# Patient Record
Sex: Female | Born: 1950 | Race: White | Hispanic: No | Marital: Single | Smoking: Current every day smoker
Health system: Southern US, Community
[De-identification: ages and names within clinical notes are randomized; demographics above are authoritative.]

## PROBLEM LIST (undated history)

## (undated) DIAGNOSIS — H5711 Ocular pain, right eye: Secondary | ICD-10-CM

## (undated) DIAGNOSIS — I639 Cerebral infarction, unspecified: Secondary | ICD-10-CM

## (undated) DIAGNOSIS — F17200 Nicotine dependence, unspecified, uncomplicated: Secondary | ICD-10-CM

## (undated) DIAGNOSIS — I700 Atherosclerosis of aorta: Secondary | ICD-10-CM

## (undated) DIAGNOSIS — K089 Disorder of teeth and supporting structures, unspecified: Secondary | ICD-10-CM

## (undated) DIAGNOSIS — I10 Essential (primary) hypertension: Secondary | ICD-10-CM

## (undated) DIAGNOSIS — H02829 Cysts of unspecified eye, unspecified eyelid: Secondary | ICD-10-CM

## (undated) DIAGNOSIS — E119 Type 2 diabetes mellitus without complications: Secondary | ICD-10-CM

## (undated) HISTORY — DX: Cysts of unspecified eye, unspecified eyelid: H02.829

## (undated) HISTORY — DX: Disorder of teeth and supporting structures, unspecified: K08.9

## (undated) HISTORY — DX: Nicotine dependence, unspecified, uncomplicated: F17.200

## (undated) HISTORY — DX: Ocular pain, right eye: H57.11

## (undated) HISTORY — DX: Cerebral infarction, unspecified: I63.9

## (undated) HISTORY — DX: Atherosclerosis of aorta: I70.0

## (undated) SURGERY — EXCISION MASS
Anesthesia: General | Laterality: Right

---

## 2015-01-23 ENCOUNTER — Encounter (HOSPITAL_COMMUNITY): Payer: Self-pay | Admitting: Emergency Medicine

## 2015-01-23 ENCOUNTER — Emergency Department (HOSPITAL_COMMUNITY)
Admission: EM | Admit: 2015-01-23 | Discharge: 2015-01-23 | Disposition: A | Discharge status: Home / Self Care | Payer: Medicaid Other | Source: Home / Self Care | Attending: Family Medicine | Admitting: Family Medicine

## 2015-01-23 DIAGNOSIS — E1165 Type 2 diabetes mellitus with hyperglycemia: Secondary | ICD-10-CM | POA: Diagnosis not present

## 2015-01-23 DIAGNOSIS — IMO0002 Reserved for concepts with insufficient information to code with codable children: Secondary | ICD-10-CM

## 2015-01-23 DIAGNOSIS — I959 Hypotension, unspecified: Secondary | ICD-10-CM

## 2015-01-23 HISTORY — DX: Essential (primary) hypertension: I10

## 2015-01-23 HISTORY — DX: Type 2 diabetes mellitus without complications: E11.9

## 2015-01-23 LAB — POCT I-STAT, CHEM 8
BUN: 7 mg/dL (ref 6–20)
CHLORIDE: 100 mmol/L — AB (ref 101–111)
Calcium, Ion: 1.25 mmol/L (ref 1.13–1.30)
Creatinine, Ser: 0.5 mg/dL (ref 0.44–1.00)
GLUCOSE: 300 mg/dL — AB (ref 65–99)
HCT: 46 % (ref 36.0–46.0)
Hemoglobin: 15.6 g/dL — ABNORMAL HIGH (ref 12.0–15.0)
POTASSIUM: 4.1 mmol/L (ref 3.5–5.1)
Sodium: 138 mmol/L (ref 135–145)
TCO2: 25 mmol/L (ref 0–100)

## 2015-01-23 MED ORDER — METFORMIN HCL 500 MG PO TABS: 500.0000 mg | ORAL_TABLET | Freq: Two times a day (BID) | ORAL | Status: DC

## 2015-01-23 NOTE — Discharge Instructions (Signed)
Your sugar is very high today. It was 300. Restart your metformin immediately. Please call our clinic tomorrow at [phone] and ask to speak to our social worker, Seward GraterMaggie,. She can help you find a new doctor. If you're unable to communicate with her please call the community health [facility] wellness Center for an appointment. Her blood pressure was too low to start blood pressure medicine. If her sugar gets to be above 200 or 300 and you become ill please go to the emergency room. Please check your sugar twice a day.

## 2015-01-23 NOTE — ED Provider Notes (Signed)
CSN: [id number]     Arrival date & time 01/23/15  1430 History   First MD Initiated Contact with Patient 01/23/15 1534     Chief Complaint  Patient presents with  . Medication Refill   (Consider location/radiation/quality/duration/timing/severity/associated sxs/prior Treatment) HPI Patient counter it aided by granddaughter who actually interpreter for patient. [place] a native [place] been in [place]. Moved to [place] 3 months ago and has not established care with a new primary care physician for refills. Has been out of her medications for this entire period of time. Does not have a glucose meter and does not check her sugars.   Diabetes: Denies tremor, headache, nausea, vomiting, diaphoresis, chest pain, palpitations. Patient states that she was on metformin 500 mg twice a day as well as glipizide 5 mg twice a day previously. Unsure what her A1c was.  Hypertension: Patient states that she is on lisinopril 5 mg prior to moving to [place] .      Past Medical History  Diagnosis Date  . Diabetes mellitus without complication   . Hypertension    History reviewed. No pertinent past surgical history. Family History  Problem Relation Age of Onset  . Family history unknown: Yes   History  Substance Use Topics  . Smoking status: Current Every Day Smoker -- 0.10 packs/day    Types: Cigarettes  . Smokeless tobacco: Not on file  . Alcohol Use: No   OB History    No data available     Review of Systems Per HPI with all other pertinent systems negative.   Allergies  Review of patient's allergies indicates no known allergies.  Home Medications   Prior to Admission medications   Medication Sig Start Date End Date Taking? Authorizing Provider  metFORMIN (GLUCOPHAGE) 500 MG tablet Take 1 tablet (500 mg total) by mouth 2 (two) times daily with a meal. 01/23/15   Ozella Rocksavid J Merrell, MD   BP 90/66 mmHg  Pulse 87  Temp(Src) 97.6 F (36.4  C) (Oral)  Resp 16  SpO2 93% Physical Exam Physical Exam  Constitutional: oriented to person, place, and time. appears well-developed and well-nourished. No distress.  HENT:  Head: Normocephalic and atraumatic.  Eyes: EOMI. PERRL.  Neck: Normal range of motion.  Cardiovascular: RRR, no m/r/g, 2+ distal pulses,  Pulmonary/Chest: Effort normal and breath sounds normal. No respiratory distress.  Abdominal: Soft. Bowel sounds are normal. NonTTP, no distension.  Musculoskeletal: Normal range of motion. Non ttp, no effusion.  Neurological: Cranial nerves II through XII grossly intact.  Skin: Skin is warm. No rash noted. non diaphoretic.  Psychiatric: normal mood and affect. behavior is normal. Judgment and thought content normal.   ED Course  Procedures (including critical care time) Labs Review Labs Reviewed  POCT I-STAT, CHEM 8 - Abnormal; Notable for the following:    Chloride 100 (*)    Glucose, Bld 300 (*)    Hemoglobin 15.6 (*)    All other components within normal limits    Imaging Review No results found.   MDM   1. Diabetes mellitus out of control   2. Hypotension, unspecified hypotension type    Chem-8 showing hyperglycemia with sugar of 300. No Noted. Patient to restart her metformin today. Creatinine normal. Patient a sympathetic with regards to her hyperglycemia. Prescription for a new diabetic meter and diabetic test strips was given to the patient and she was counseled to check her sugars twice daily. Will not start  glipizide at this point time. Patient go to the ED if she develops hyperglycemia along with other systemic symptoms. Of note patient is hypotensive at this current point in time and starting lisinopril 5 mg which was her previous dose is not advisable. Patient to call tomorrow to speak to our social worker with regards to establishing care primary care office or will call the daily health wellness Center tomorrow to establish her care for the  future.    Ozella Rocksavid J Merrell, MD 01/23/15 [phone]

## 2015-01-23 NOTE — ED Notes (Signed)
Reports being out BP med and DM meds for 3 months.   C/o mild headaches.   Denies any other symptoms

## 2015-03-03 ENCOUNTER — Ambulatory Visit (INDEPENDENT_AMBULATORY_CARE_PROVIDER_SITE_OTHER): Payer: Medicaid Other | Admitting: Family Medicine

## 2015-03-03 VITALS — BP 136/65 | HR 87 | Temp 98.1°F | Ht <= 58 in | Wt 91.4 lb

## 2015-03-03 DIAGNOSIS — E119 Type 2 diabetes mellitus without complications: Secondary | ICD-10-CM

## 2015-03-03 DIAGNOSIS — Z0289 Encounter for other administrative examinations: Secondary | ICD-10-CM

## 2015-03-03 DIAGNOSIS — K088 Other specified disorders of teeth and supporting structures: Secondary | ICD-10-CM | POA: Diagnosis not present

## 2015-03-03 DIAGNOSIS — K089 Disorder of teeth and supporting structures, unspecified: Secondary | ICD-10-CM

## 2015-03-03 DIAGNOSIS — Z008 Encounter for other general examination: Secondary | ICD-10-CM | POA: Diagnosis not present

## 2015-03-03 MED ORDER — GLIPIZIDE 5 MG PO TABS: 5.0000 mg | ORAL_TABLET | Freq: Two times a day (BID) | ORAL | Status: DC

## 2015-03-03 MED ORDER — METFORMIN HCL 1000 MG PO TABS: 1000.0000 mg | ORAL_TABLET | Freq: Two times a day (BID) | ORAL | Status: DC

## 2015-03-03 NOTE — Progress Notes (Signed)
Patient ID: Kaylee Ramos, female   DOB: 02/25/1951, 64 y.o.   MRN: [id number] Nepali interpreter utilized during today's visit.  [facility]  HPI:  [facility] for a new patient appointment to establish general primary care, also to discuss diabetes.  DIABETES - had for 8-9 years Disease Monitoring: Blood Sugar ranges-300-400, sometimes up to 500 and other times 150 Polyuria/phagia/dipsia- polyuria, nocturia, polydipsia      Visual problems- vision is ok, has eyelid lesion Medications: Compliance- takes metformin 500 BID, has been out of this Hypoglycemic symptoms- no tremor, some sweating though this improves with drinking water  ROS: See HPI  Immigrant Social History: - Name spelling correct?: [name] - Date arrived in [place]: 1.5 years ago - Country of origin: [place] born [place] - Location of refugee camp (if applicable), how long there, and what caused patient to leave home country?: was in refugee camp for 25 years. Left due to political dispute. 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 - Best contact name and number: [phone] - Tobacco/alcohol/drug use: smokes 2-3 cigs per day, no alcohol or drugs - Marriage Status: widowed - Sexual activity: no  Preventative Care History: -Seen at health department?: went when arrived in [place], not in [place]  -had blood drawn and immunizations in [place]  Past Medical Hx:  -DM -history of issue with right hand -  Decreased strength in right hand for 7-8 years  Past Surgical Hx:  -left eye lid surgery  Family Hx: updated in Epic - Number of family members:  3 kids - Number of family members in [place]:  2 daughters in [place]  PHYSICAL EXAM: BP 136/65 mmHg  Pulse 87  Temp(Src) 98.1 F (36.7 C) (Oral)  Ht 4' 9.5" (1.461 m)  Wt 91 lb 6.4 oz (41.459 kg)  BMI 19.42 kg/m2 Gen:  NAD, resting comfortably on exam table HEENT: NCAT, MMM, poor dentition with no teeth on top, missing teeth and discolored teeth on bottom of mouth, no cervical LAD Neck:  Supple, no LAD Heart: rrr, no murmur appreciated Lungs: CTAB, nml WOB Abdomen: s, NT, ND Skin:  Right eye lid with ~0.5 cm nodule in the middle of the eyelid with no erythema or tenderness MSK: no joint swelling, normal muscle bulk Neuro: alert, 4+/5 strength right hand grip, otherwise 5/5 strength UE Psych: normal affect  Examined and interviewed with Dr. Gwendolyn GrantWalden  FOLLOW UP: F/u in 2 weeks for DM.  Diabetes Patient with poorly controlled DM per report of CBGs at home. Will increase metformin to 1000 mg BID. Will start on glipizide 5 mg BID. Advised that these are $4 meds at [facility]. Given paper prescriptions. Patient left the office prior to having A1c performed. Will have patient return for A1c check.  Poor dentition Patient with poor dentition. Will refer to dentist.

## 2015-03-03 NOTE — Patient Instructions (Signed)
Nice to meet you. We are going to start you on glipizide for your diabetes and increase the dose of your metformin.  We will refer you to a dentist. We would like for you to come back in 2 weeks to follow-up.

## 2015-03-04 DIAGNOSIS — Z0289 Encounter for other administrative examinations: Secondary | ICD-10-CM | POA: Insufficient documentation

## 2015-03-04 DIAGNOSIS — K089 Disorder of teeth and supporting structures, unspecified: Secondary | ICD-10-CM

## 2015-03-04 DIAGNOSIS — E119 Type 2 diabetes mellitus without complications: Secondary | ICD-10-CM | POA: Insufficient documentation

## 2015-03-04 HISTORY — DX: Disorder of teeth and supporting structures, unspecified: K08.9

## 2015-03-04 NOTE — Assessment & Plan Note (Addendum)
Patient with poorly controlled DM per report of CBGs at home. Will increase metformin to 1000 mg BID. Will start on glipizide 5 mg BID. Advised that these are $4 meds at [facility]. Given paper prescriptions. Patient left the office prior to having A1c performed. Will have patient return for A1c check.

## 2015-03-04 NOTE — Assessment & Plan Note (Signed)
Patient with poor dentition. Will refer to dentist.

## 2015-03-24 ENCOUNTER — Telehealth: Payer: Self-pay | Admitting: Family Medicine

## 2015-03-24 NOTE — Telephone Encounter (Signed)
Patient's Neighbor dropped Form to be complete and signed by PCP by March 26, 2015. Please, follow up with Mr. Kaylee Ramos.

## 2015-03-24 NOTE — Telephone Encounter (Signed)
Reviewed patient chart. Not having met pt before, there isn't enough documented for me to complete the form. Will defer to Dr. Mingo Amber upon his return.  Leeanne Rio, MD

## 2015-03-24 NOTE — Telephone Encounter (Signed)
Forms given to Dr. Pollie MeyerMcIntyre to see if she could complete them. Kaylee Ramos, Mahala Rommel D, [place]

## 2015-03-26 NOTE — Telephone Encounter (Signed)
I will complete at her appointment in 2 days.

## 2015-03-28 ENCOUNTER — Ambulatory Visit: Payer: Medicaid Other | Admitting: Family Medicine

## 2015-04-01 ENCOUNTER — Ambulatory Visit (INDEPENDENT_AMBULATORY_CARE_PROVIDER_SITE_OTHER): Payer: Medicaid Other | Admitting: Family Medicine

## 2015-04-01 ENCOUNTER — Encounter: Payer: Self-pay | Admitting: Family Medicine

## 2015-04-01 VITALS — Temp 98.0°F | Ht <= 58 in | Wt 91.9 lb

## 2015-04-01 DIAGNOSIS — M6289 Other specified disorders of muscle: Secondary | ICD-10-CM | POA: Diagnosis not present

## 2015-04-01 DIAGNOSIS — H02823 Cysts of right eye, unspecified eyelid: Secondary | ICD-10-CM | POA: Diagnosis not present

## 2015-04-01 DIAGNOSIS — E119 Type 2 diabetes mellitus without complications: Secondary | ICD-10-CM

## 2015-04-01 DIAGNOSIS — I639 Cerebral infarction, unspecified: Secondary | ICD-10-CM

## 2015-04-01 DIAGNOSIS — R29898 Other symptoms and signs involving the musculoskeletal system: Secondary | ICD-10-CM

## 2015-04-01 LAB — POCT GLYCOSYLATED HEMOGLOBIN (HGB A1C): Hemoglobin A1C: 9.2

## 2015-04-01 MED ORDER — BACLOFEN 10 MG PO TABS: 10.0000 mg | ORAL_TABLET | Freq: Three times a day (TID) | ORAL | Status: DC

## 2015-04-01 NOTE — Progress Notes (Signed)
Subjective:   Stage manager used for entire visit.  Neighbor who speaks [place] and daughter also present.    [name] is a 64 y.o. female who presents to [facility] today for several issues:  1.  Diabetes:  Currently on Metformin and Glipizide.  No adverse effects from medication.  No hypoglycemic events.  No paresthesia or peripheral nerve pain.  Measures blood sugars at home every:  Measures blood sugars on daughter's machine every day.  Usually runs 200-300.  CBGs better since last visit and increase in Metformin/Glipizide.  Noted a 40 CBG about 2 weeks ago.  Had not eaten that day or day before, measured in early afternoon, had continued to take her Glipizide during that time.  Felt lightheaded.  Improved with food.    Lab Results  Component Value Date   HGBA1C 9.2 04/01/2015   2.  Form completion:  Has form with her for personal care.  She evidently had an aide when she lived in [place] secondary to her Right sided paralysis.    3.  Right eye nodule:  Had similar nodule on Left eyelid which was surgically removed.  Would like RIght nodule removed, feels it is growing in size.  Not able to fully open her eye.  Itches.  No eye swelling or conjunctivitis.    4.  Right hand:  Pain and stiffiness in Right hand present for 6-8 years.  Started when she was in [place].  Woke one day and noted paralysis in almost her entire Right hand and arm.  Has had contractures since then, but very gradual improvement in strength.  However, she cannot really use her Right hand due to her symptoms, including lifting things, fixing her hair, holding cups, and other activities of daily living.  Weak in Right hand as well.    ROS as above per HPI, otherwise neg.    The following portions of the patient's history were reviewed and updated as appropriate: allergies, current medications, past medical history, family and social history, and problem list. Patient is a former smoker. Quit after coming to the [place].      PMH reviewed.  Past Medical History  Diagnosis Date  . Diabetes mellitus without complication   . Hypertension    No past surgical history on file.  Medications reviewed. Current Outpatient Prescriptions  Medication Sig Dispense Refill  . glipiZIDE (GLUCOTROL) 5 MG tablet Take 1 tablet (5 mg total) by mouth 2 (two) times daily before a meal. 60 tablet 3  . metFORMIN (GLUCOPHAGE) 1000 MG tablet Take 1 tablet (1,000 mg total) by mouth 2 (two) times daily with a meal. 60 tablet 3   No current facility-administered medications for this visit.     Objective:   Physical Exam Temp(Src) 98 F (36.7 C) (Oral)  Ht 4' 9.5" (1.461 m)  Wt 91 lb 14.4 oz (41.686 kg)  BMI 19.53 kg/m2 Gen:  Alert, cooperative patient who appears stated age in no acute distress.  Vital signs reviewed. HEENT: EOMI,  MMM.  Right eye with 0.8 mm nodule attached to exterior or Right eyelid.  Non-painful, non-erythematous.  Non-fluctuant.   Cardiac:  Regular rate and rhythm without murmur auscultated.  Good S1/S2. Pulm:  Clear to auscultation bilaterally with good air movement.   Exts: Non edematous BL  LE, warm and well perfused.  MSK:  2/5 strength in Right hand.  Minimally able to pronate or supinate hand against resistance.  Bicep strength is 2/5.  Tricep strength is 3/5.  Upper shoulder strength is about 3/5.  Strength is 5/5 LUE, and 4/5 BL LE's. Neuro:  Sensation intact along BL UE's and in hand.  DTRs +3 triceps and brachioradialis on Right, +2 on Left.  Able to walk well.    Results for orders placed or performed in visit on 04/01/15 (from the past 72 hour(s))  POCT A1C     Status: Abnormal   Collection Time: 04/01/15 10:00 AM  Result Value Ref Range   Hemoglobin A1C 9.2

## 2015-04-01 NOTE — Patient Instructions (Addendum)
Please write down your blood sugars in the AM.  Bring this to me in a month.    You have had a stroke.  We need to prevent this again in the future.  I have sent in a muscle relaxer for your Right arm.  The eye doctors will do the surgery after we get your blood pressure under control.  We will get the forms completed for you.

## 2015-04-02 DIAGNOSIS — H02829 Cysts of unspecified eye, unspecified eyelid: Secondary | ICD-10-CM | POA: Insufficient documentation

## 2015-04-02 DIAGNOSIS — I639 Cerebral infarction, unspecified: Secondary | ICD-10-CM

## 2015-04-02 DIAGNOSIS — G8321 Monoplegia of upper limb affecting right dominant side: Secondary | ICD-10-CM | POA: Insufficient documentation

## 2015-04-02 HISTORY — DX: Cerebral infarction, unspecified: I63.9

## 2015-04-02 HISTORY — DX: Cysts of unspecified eye, unspecified eyelid: H02.829

## 2015-04-02 NOTE — Assessment & Plan Note (Signed)
Not controlled.  Counseled on hypoglycemic events.  Discussed eating regularly with glipizide usage.   Family states that CBGs have gradually improved since starting this new medication regimen.   No changes to medicines today.  Will FU in 1 month.   Family to bring glucometer at that meeting.  Family unable to read or write in [place] or [place].  Will review glucometer and make adjustments based on readings at next OV.

## 2015-04-02 NOTE — Assessment & Plan Note (Signed)
Remote, at least 6-8 years ago. While in [place]. Has gained minimal strength back. Greatly limits her ADLs and IADLs as she is right handed and having to use her non-dominant hand. Will complete form for personal care aide and give to Theresia Bough LCSW.

## 2015-04-02 NOTE — Assessment & Plan Note (Signed)
With right arm weakness.   See CVA assessment.

## 2015-04-02 NOTE — Assessment & Plan Note (Signed)
Had unknown surgery on similar cyst of Left eyelid while in [place].   Asking for derm referral today.  Discussed we need to get her diabetes under better control before discussing surgery.  States she was told this in [place] as well.

## 2015-04-11 ENCOUNTER — Encounter: Payer: Self-pay | Admitting: Clinical

## 2015-04-11 NOTE — Progress Notes (Signed)
PCS form faxed to [facility].  Ceaira Ernster, MSW, LCSW [phone]  

## 2015-05-02 ENCOUNTER — Ambulatory Visit: Payer: Medicaid Other | Admitting: Family Medicine

## 2015-05-14 ENCOUNTER — Ambulatory Visit: Payer: Medicaid Other | Admitting: Family Medicine

## 2015-06-06 ENCOUNTER — Ambulatory Visit (INDEPENDENT_AMBULATORY_CARE_PROVIDER_SITE_OTHER): Payer: Medicaid Other | Admitting: Family Medicine

## 2015-06-06 ENCOUNTER — Encounter: Payer: Self-pay | Admitting: Family Medicine

## 2015-06-06 ENCOUNTER — Ambulatory Visit: Payer: Medicaid Other | Admitting: Family Medicine

## 2015-06-06 VITALS — BP 103/75 | HR 87 | Temp 98.2°F | Ht <= 58 in | Wt 91.5 lb

## 2015-06-06 DIAGNOSIS — J209 Acute bronchitis, unspecified: Secondary | ICD-10-CM

## 2015-06-06 DIAGNOSIS — E1165 Type 2 diabetes mellitus with hyperglycemia: Secondary | ICD-10-CM

## 2015-06-06 DIAGNOSIS — R29898 Other symptoms and signs involving the musculoskeletal system: Secondary | ICD-10-CM

## 2015-06-06 DIAGNOSIS — M6289 Other specified disorders of muscle: Secondary | ICD-10-CM | POA: Diagnosis not present

## 2015-06-06 DIAGNOSIS — IMO0002 Reserved for concepts with insufficient information to code with codable children: Secondary | ICD-10-CM

## 2015-06-06 DIAGNOSIS — Z23 Encounter for immunization: Secondary | ICD-10-CM

## 2015-06-06 MED ORDER — GLIPIZIDE 5 MG PO TABS: 5.0000 mg | ORAL_TABLET | Freq: Two times a day (BID) | ORAL | Status: DC

## 2015-06-06 MED ORDER — DEXTROMETHORPHAN-GUAIFENESIN 20-400 MG PO TABS: 1.0000 | ORAL_TABLET | Freq: Two times a day (BID) | ORAL | Status: DC | PRN

## 2015-06-06 MED ORDER — METFORMIN HCL 1000 MG PO TABS: 1000.0000 mg | ORAL_TABLET | Freq: Two times a day (BID) | ORAL | Status: DC

## 2015-06-06 MED ORDER — GLUCOSE BLOOD VI STRP: ORAL_STRIP | Status: DC

## 2015-06-06 MED ORDER — ACCU-CHEK FASTCLIX LANCETS MISC: Status: DC

## 2015-06-06 MED ORDER — DOXYCYCLINE HYCLATE 100 MG PO TABS: 100.0000 mg | ORAL_TABLET | Freq: Two times a day (BID) | ORAL | Status: DC

## 2015-06-06 MED ORDER — ACCU-CHEK NANO SMARTVIEW W/DEVICE KIT: PACK | Status: AC

## 2015-06-06 NOTE — Assessment & Plan Note (Signed)
Ran out of glipizide several days ago. Reports good compliance otherwise. Ran out of test strips. Rx sent for new meter, lancets, strips for once daily testing. Continue metformin and glipizide. Referral sent for optometry. Asked to return in 1 month for repeat a1c.

## 2015-06-06 NOTE — Assessment & Plan Note (Signed)
Requested additional personal care services. Will contact current agency and see what else can be offered.

## 2015-06-06 NOTE — Progress Notes (Signed)
   Subjective:    Patient ID: Kaylee Ramos, female    DOB: 12-22-1950, 64 y.o.   MRN: [id number]  HPI  CC: follow up  # Diabetes:  Medicines: metformin and glipizide. Ran out of glipizide  Reports no side effects, tolerates medicines well.  CBG monitoring: ran out of test strips ROS: no dysuria/polyuria  # Short of breath  cough x 8 days  Productive of sputum  No fever now, had one a few days  No known sick contacts  Tried: nothing ROS: as above  # Hx of CVA and residual right sided weakness Requesting additional support for care services at home. Do to her deficits she is unable to really take care of any ADLs. She has assistance in the morning for 2.5 hours but needs help during the evening.   Home care services by [facility]. Karlyne Greenspan agency coordinator [phone], fax [phone], email [email or web address]  Social Hx: former smoker  Review of Systems   See HPI for ROS.   Past medical history, surgical, family, and social history reviewed and updated in the EMR as appropriate. Objective:  BP 103/75 mmHg  Pulse 87  Temp(Src) 98.2 F (36.8 C) (Oral)  Ht  (1.473 m)  Wt 91 lb 8 oz (41.504 kg)  BMI 19.13 kg/m2 Vitals and nursing note reviewed  General: NAD Eye: cyst on superior eyelid of the right eye. Able to open and close eye. CV: RRR, normal s1s2, no murmurs/rub/gallop  Resp: rhonchi and exp wheezes on the right, normal effort  Assessment & Plan:  Type 2 diabetes mellitus, uncontrolled Ran out of glipizide several days ago. Reports good compliance otherwise. Ran out of test strips. Rx sent for new meter, lancets, strips for once daily testing. Continue metformin and glipizide. Referral sent for optometry. Asked to return in 1 month for repeat a1c.  Right hand weakness Requested additional personal care services. Will contact current agency and see what else can be offered.  Shortness of breath due to  bronchitis - Exam concerning with right sided adventitious breath sounds. Overall appears to be well. Doxycycline x 5 days. Follow up if not improving.

## 2015-06-06 NOTE — Patient Instructions (Signed)
Doxycycline twice a day for 5 days

## 2015-06-16 ENCOUNTER — Telehealth: Payer: Self-pay | Admitting: Family Medicine

## 2015-06-16 NOTE — Telephone Encounter (Signed)
Spoke with Kaylee Ramos to request PCS form. She says form was picked up but it had not been dropped off at our office. I asked her to fax another form to our office so we can fill this out for additional PCS hours. Fax number given, she said it would be sent tomorrow since she is out of the office today. -Dr. Waynetta Sandy

## 2015-07-01 ENCOUNTER — Encounter: Payer: Self-pay | Admitting: Family Medicine

## 2015-07-07 ENCOUNTER — Ambulatory Visit (INDEPENDENT_AMBULATORY_CARE_PROVIDER_SITE_OTHER): Payer: Medicaid Other | Admitting: Family Medicine

## 2015-07-07 VITALS — BP 120/80 | HR 79 | Temp 98.2°F | Ht <= 58 in | Wt 89.4 lb

## 2015-07-07 DIAGNOSIS — G8320 Monoplegia of upper limb affecting unspecified side: Secondary | ICD-10-CM

## 2015-07-07 DIAGNOSIS — H02823 Cysts of right eye, unspecified eyelid: Secondary | ICD-10-CM | POA: Diagnosis not present

## 2015-07-07 DIAGNOSIS — G8321 Monoplegia of upper limb affecting right dominant side: Secondary | ICD-10-CM

## 2015-07-07 DIAGNOSIS — E1165 Type 2 diabetes mellitus with hyperglycemia: Secondary | ICD-10-CM | POA: Diagnosis not present

## 2015-07-07 DIAGNOSIS — IMO0001 Reserved for inherently not codable concepts without codable children: Secondary | ICD-10-CM

## 2015-07-07 LAB — POCT GLYCOSYLATED HEMOGLOBIN (HGB A1C): HEMOGLOBIN A1C: 7.5

## 2015-07-07 NOTE — Patient Instructions (Addendum)
Please call the personal care services to make sure they got our paperwork, if needed have them fax paperwork back so we can fill it out.   We will make a referral to the eye surgeons ([facility] -- this appointment is for diabetes check) to look at the lesion on the right eyelid. You should be getting a phone call.

## 2015-07-07 NOTE — Progress Notes (Signed)
   Subjective:    Patient ID: Man Lubertha SayresMaya Pixler, female    DOB: 03/05/1951, 64 y.o.   MRN: [id number]  HPI  Video interpreter used  CC:   # Diabetes:  Feels going well  Tolerating glipizide and metformin, denies any issues with it  Checking sugar once a day usually in morning, none >300 or <70. ROS: no polyuria, polydipsia, no pain  # Partial paralysis right hand  Needs additional PCS hours. Form was filled out and faxed last week but they haven't heard back yet  Denies pain  Able to hold arm up but can't grip objects in right hand  # Right eyelid lesion  Would like to get this removed  Previously had one on the left eye that was smaller, this was removed with surgery  Social Hx: former smoker  Review of Systems   See HPI for ROS.   Past medical history, surgical, family, and social history reviewed and updated in the EMR as appropriate. Objective:  BP 120/80 mmHg  Pulse 79  Temp(Src) 98.2 F (36.8 C) (Oral)  Ht 4\' 10"  (1.473 m)  Wt 89 lb 6.4 oz (40.552 kg)  BMI 18.69 kg/m2 Vitals and nursing note reviewed  General: NAD Eyes: right upper eyelid with lesion approx 30% width of the eye, no drainage noted CV: RRR, normal s1s2, no murmurs, rubs or gallop. 2+ radial pulses bilaterally  Resp: clear to auscultation bilaterally, normal effort Extremities: holds right arm and hand up with her left. There is no deformity of the upper extremities. There is no edema. Neuro: alert and oriented. Right arm strength 2/5, left 5/5. Right grip strength 2/5, left 5/5. Gait is normal.  Assessment & Plan:  Type 2 diabetes mellitus, uncontrolled Repeat A1c at goal 7.5. Continue current regimen glipizide/metformin. Follow up 3 months.  Partial paralysis of right hand (HCC) New PCS forms were faxed last week. Asked neighbor that accompanies patient to call [facility] if they do not hear anything.

## 2015-07-09 NOTE — Assessment & Plan Note (Signed)
Repeat A1c at goal 7.5. Continue current regimen glipizide/metformin. Follow up 3 months.

## 2015-07-09 NOTE — Assessment & Plan Note (Signed)
New PCS forms were faxed last week. Asked neighbor that accompanies patient to call [facility] if they do not hear anything.

## 2015-08-06 ENCOUNTER — Other Ambulatory Visit: Payer: Self-pay | Admitting: *Deleted

## 2015-08-06 MED ORDER — METFORMIN HCL 1000 MG PO TABS: 1000.0000 mg | ORAL_TABLET | Freq: Two times a day (BID) | ORAL | Status: DC

## 2015-08-07 ENCOUNTER — Encounter (HOSPITAL_BASED_OUTPATIENT_CLINIC_OR_DEPARTMENT_OTHER): Payer: Self-pay | Admitting: *Deleted

## 2015-08-07 ENCOUNTER — Other Ambulatory Visit: Payer: Self-pay | Admitting: Specialist

## 2015-08-07 NOTE — Pre-Procedure Instructions (Signed)
Pt from [place] and does not speak english. Pre op call done with Family friend Ram who will be here on the DOS.  Also requested a [place]ali interpreter for [place], email sent to [employer].

## 2015-08-08 ENCOUNTER — Other Ambulatory Visit: Payer: Self-pay | Admitting: Specialist

## 2015-08-08 ENCOUNTER — Other Ambulatory Visit: Payer: Self-pay

## 2015-08-08 ENCOUNTER — Encounter (HOSPITAL_BASED_OUTPATIENT_CLINIC_OR_DEPARTMENT_OTHER)
Admission: RE | Admit: 2015-08-08 | Discharge: 2015-08-08 | Disposition: A | Discharge status: Home / Self Care | Payer: Medicaid Other | Source: Ambulatory Visit | Attending: Specialist | Admitting: Specialist

## 2015-08-08 ENCOUNTER — Ambulatory Visit: Payer: Self-pay | Admitting: Specialist

## 2015-08-08 DIAGNOSIS — Z01818 Encounter for other preprocedural examination: Secondary | ICD-10-CM | POA: Diagnosis not present

## 2015-08-08 DIAGNOSIS — Z79899 Other long term (current) drug therapy: Secondary | ICD-10-CM | POA: Diagnosis not present

## 2015-08-08 DIAGNOSIS — R220 Localized swelling, mass and lump, head: Secondary | ICD-10-CM | POA: Insufficient documentation

## 2015-08-08 DIAGNOSIS — R229 Localized swelling, mass and lump, unspecified: Secondary | ICD-10-CM | POA: Diagnosis present

## 2015-08-08 DIAGNOSIS — Z7984 Long term (current) use of oral hypoglycemic drugs: Secondary | ICD-10-CM | POA: Diagnosis not present

## 2015-08-08 DIAGNOSIS — Z8673 Personal history of transient ischemic attack (TIA), and cerebral infarction without residual deficits: Secondary | ICD-10-CM | POA: Diagnosis not present

## 2015-08-08 DIAGNOSIS — L720 Epidermal cyst: Secondary | ICD-10-CM | POA: Diagnosis not present

## 2015-08-08 DIAGNOSIS — E119 Type 2 diabetes mellitus without complications: Secondary | ICD-10-CM | POA: Diagnosis not present

## 2015-08-08 DIAGNOSIS — Z87891 Personal history of nicotine dependence: Secondary | ICD-10-CM | POA: Diagnosis not present

## 2015-08-08 DIAGNOSIS — I10 Essential (primary) hypertension: Secondary | ICD-10-CM | POA: Diagnosis not present

## 2015-08-08 LAB — BASIC METABOLIC PANEL
ANION GAP: 7 (ref 5–15)
BUN: 11 mg/dL (ref 6–20)
CALCIUM: 9.4 mg/dL (ref 8.9–10.3)
CO2: 28 mmol/L (ref 22–32)
CREATININE: 0.49 mg/dL (ref 0.44–1.00)
Chloride: 104 mmol/L (ref 101–111)
Glucose, Bld: 224 mg/dL — ABNORMAL HIGH (ref 65–99)
Potassium: 4.1 mmol/L (ref 3.5–5.1)
SODIUM: 139 mmol/L (ref 135–145)

## 2015-08-11 ENCOUNTER — Ambulatory Visit (HOSPITAL_BASED_OUTPATIENT_CLINIC_OR_DEPARTMENT_OTHER)
Admission: RE | Admit: 2015-08-11 | Discharge: 2015-08-11 | Disposition: A | Discharge status: Home / Self Care | Payer: Medicaid Other | Source: Ambulatory Visit | Attending: Specialist | Admitting: Specialist

## 2015-08-11 ENCOUNTER — Encounter (HOSPITAL_BASED_OUTPATIENT_CLINIC_OR_DEPARTMENT_OTHER)
Admission: RE | Disposition: A | Discharge status: Home / Self Care | Payer: Self-pay | Source: Ambulatory Visit | Attending: Specialist

## 2015-08-11 ENCOUNTER — Encounter (HOSPITAL_BASED_OUTPATIENT_CLINIC_OR_DEPARTMENT_OTHER): Payer: Self-pay | Admitting: Anesthesiology

## 2015-08-11 ENCOUNTER — Ambulatory Visit (HOSPITAL_BASED_OUTPATIENT_CLINIC_OR_DEPARTMENT_OTHER): Payer: Medicaid Other | Admitting: Anesthesiology

## 2015-08-11 DIAGNOSIS — L720 Epidermal cyst: Secondary | ICD-10-CM | POA: Insufficient documentation

## 2015-08-11 DIAGNOSIS — Z79899 Other long term (current) drug therapy: Secondary | ICD-10-CM | POA: Insufficient documentation

## 2015-08-11 DIAGNOSIS — Z87891 Personal history of nicotine dependence: Secondary | ICD-10-CM | POA: Insufficient documentation

## 2015-08-11 DIAGNOSIS — Z8673 Personal history of transient ischemic attack (TIA), and cerebral infarction without residual deficits: Secondary | ICD-10-CM | POA: Diagnosis not present

## 2015-08-11 DIAGNOSIS — E119 Type 2 diabetes mellitus without complications: Secondary | ICD-10-CM | POA: Insufficient documentation

## 2015-08-11 DIAGNOSIS — I10 Essential (primary) hypertension: Secondary | ICD-10-CM | POA: Insufficient documentation

## 2015-08-11 DIAGNOSIS — Z7984 Long term (current) use of oral hypoglycemic drugs: Secondary | ICD-10-CM | POA: Insufficient documentation

## 2015-08-11 HISTORY — DX: Cerebral infarction, unspecified: I63.9

## 2015-08-11 HISTORY — PX: MASS EXCISION: SHX2000

## 2015-08-11 LAB — GLUCOSE, CAPILLARY
GLUCOSE-CAPILLARY: 111 mg/dL — AB (ref 65–99)
Glucose-Capillary: 68 mg/dL (ref 65–99)
Glucose-Capillary: 155 mg/dL — ABNORMAL HIGH (ref 65–99)

## 2015-08-11 SURGERY — EXCISION MASS
Anesthesia: General | Laterality: Right

## 2015-08-11 MED ORDER — ONDANSETRON HCL 4 MG/2ML IJ SOLN
INTRAMUSCULAR | Status: AC
  Filled 2015-08-11: qty 2

## 2015-08-11 MED ORDER — LACTATED RINGERS IV SOLN
INTRAVENOUS | Status: DC
  Administered 2015-08-11: 11:00:00 via INTRAVENOUS

## 2015-08-11 MED ORDER — ONDANSETRON HCL 4 MG/2ML IJ SOLN
INTRAMUSCULAR | Status: DC | PRN
  Administered 2015-08-11: 4 mg via INTRAVENOUS

## 2015-08-11 MED ORDER — LIDOCAINE HCL (CARDIAC) 20 MG/ML IV SOLN
INTRAVENOUS | Status: AC
  Filled 2015-08-11: qty 5

## 2015-08-11 MED ORDER — CEFAZOLIN SODIUM-DEXTROSE 2-3 GM-% IV SOLR
INTRAVENOUS | Status: AC
  Filled 2015-08-11: qty 50

## 2015-08-11 MED ORDER — MIDAZOLAM HCL 2 MG/2ML IJ SOLN
INTRAMUSCULAR | Status: AC
  Filled 2015-08-11: qty 2

## 2015-08-11 MED ORDER — SCOPOLAMINE 1 MG/3DAYS TD PT72: 1.0000 | MEDICATED_PATCH | Freq: Once | TRANSDERMAL | Status: DC | PRN

## 2015-08-11 MED ORDER — PROPOFOL 500 MG/50ML IV EMUL
INTRAVENOUS | Status: AC
  Filled 2015-08-11: qty 50

## 2015-08-11 MED ORDER — FENTANYL CITRATE (PF) 100 MCG/2ML IJ SOLN
INTRAMUSCULAR | Status: AC
Start: 2015-08-11 — End: 2015-08-11
  Filled 2015-08-11: qty 2

## 2015-08-11 MED ORDER — DEXAMETHASONE SODIUM PHOSPHATE 10 MG/ML IJ SOLN
INTRAMUSCULAR | Status: AC
  Filled 2015-08-11: qty 1

## 2015-08-11 MED ORDER — LIDOCAINE-EPINEPHRINE 0.5 %-1:200000 IJ SOLN
INTRAMUSCULAR | Status: DC | PRN
  Administered 2015-08-11: 3 mL

## 2015-08-11 MED ORDER — DEXAMETHASONE SODIUM PHOSPHATE 4 MG/ML IJ SOLN
INTRAMUSCULAR | Status: DC | PRN
  Administered 2015-08-11: 4 mg via INTRAVENOUS

## 2015-08-11 MED ORDER — DEXTROSE 50 % IV SOLN
INTRAVENOUS | Status: AC
  Filled 2015-08-11: qty 50

## 2015-08-11 MED ORDER — LIDOCAINE HCL (CARDIAC) 20 MG/ML IV SOLN
INTRAVENOUS | Status: DC | PRN
  Administered 2015-08-11: 40 mg via INTRAVENOUS
  Administered 2015-08-11: 100 mg via INTRAVENOUS

## 2015-08-11 MED ORDER — FENTANYL CITRATE (PF) 100 MCG/2ML IJ SOLN
INTRAMUSCULAR | Status: AC
  Filled 2015-08-11: qty 2

## 2015-08-11 MED ORDER — DEXTROSE 50 % IV SOLN
INTRAVENOUS | Status: DC | PRN
  Administered 2015-08-11: .5 via INTRAVENOUS

## 2015-08-11 MED ORDER — FENTANYL CITRATE (PF) 100 MCG/2ML IJ SOLN
25.0000 ug | INTRAMUSCULAR | Status: DC | PRN
  Administered 2015-08-11: 25 ug via INTRAVENOUS

## 2015-08-11 MED ORDER — ARTIFICIAL TEARS OP OINT
TOPICAL_OINTMENT | OPHTHALMIC | Status: AC
  Filled 2015-08-11: qty 3.5

## 2015-08-11 MED ORDER — ONDANSETRON HCL 4 MG/2ML IJ SOLN: 4.0000 mg | Freq: Four times a day (QID) | INTRAMUSCULAR | Status: DC | PRN

## 2015-08-11 MED ORDER — OXYCODONE HCL 5 MG/5ML PO SOLN: 5.0000 mg | Freq: Once | ORAL | Status: DC | PRN

## 2015-08-11 MED ORDER — OXYCODONE HCL 5 MG PO TABS: 5.0000 mg | ORAL_TABLET | Freq: Once | ORAL | Status: DC | PRN

## 2015-08-11 MED ORDER — GLYCOPYRROLATE 0.2 MG/ML IJ SOLN: 0.2000 mg | Freq: Once | INTRAMUSCULAR | Status: DC | PRN

## 2015-08-11 MED ORDER — FENTANYL CITRATE (PF) 100 MCG/2ML IJ SOLN
50.0000 ug | INTRAMUSCULAR | Status: DC | PRN
  Administered 2015-08-11: 50 ug via INTRAVENOUS

## 2015-08-11 MED ORDER — CEFAZOLIN SODIUM-DEXTROSE 2-3 GM-% IV SOLR
2.0000 g | INTRAVENOUS | Status: AC
  Administered 2015-08-11: 2 g via INTRAVENOUS

## 2015-08-11 MED ORDER — MIDAZOLAM HCL 2 MG/2ML IJ SOLN: 1.0000 mg | INTRAMUSCULAR | Status: DC | PRN

## 2015-08-11 SURGICAL SUPPLY — 61 items
BENZOIN TINCTURE PRP APPL 2/3 (GAUZE/BANDAGES/DRESSINGS) IMPLANT
BLADE HEX COATED 2.75 (ELECTRODE) IMPLANT
BLADE KNIFE PERSONA 10 (BLADE) ×3 IMPLANT
BLADE KNIFE PERSONA 15 (BLADE) ×3 IMPLANT
BNDG ESMARK 4X9 LF (GAUZE/BANDAGES/DRESSINGS) ×3 IMPLANT
CANISTER SUCT 1200ML W/VALVE (MISCELLANEOUS) IMPLANT
CLOSURE WOUND 1/2 X4 (GAUZE/BANDAGES/DRESSINGS)
COTTONBALL LRG STERILE PKG (GAUZE/BANDAGES/DRESSINGS) IMPLANT
COVER BACK TABLE 60X90IN (DRAPES) ×3 IMPLANT
COVER MAYO STAND STRL (DRAPES) ×3 IMPLANT
DRAPE LAPAROSCOPIC ABDOMINAL (DRAPES) IMPLANT
DRAPE U-SHAPE 76X120 STRL (DRAPES) ×3 IMPLANT
DRSG PAD ABDOMINAL 8X10 ST (GAUZE/BANDAGES/DRESSINGS) ×3 IMPLANT
ELECT NEEDLE BLADE 2-5/6 (NEEDLE) ×3 IMPLANT
ELECT REM PT RETURN 9FT ADLT (ELECTROSURGICAL) ×3
ELECTRODE REM PT RTRN 9FT ADLT (ELECTROSURGICAL) ×1 IMPLANT
GAUZE SPONGE 4X4 12PLY STRL (GAUZE/BANDAGES/DRESSINGS) ×3 IMPLANT
GAUZE XEROFORM 1X8 LF (GAUZE/BANDAGES/DRESSINGS) ×3 IMPLANT
GAUZE XEROFORM 5X9 LF (GAUZE/BANDAGES/DRESSINGS) IMPLANT
GLOVE BIO SURGEON STRL SZ 6.5 (GLOVE) ×2 IMPLANT
GLOVE BIO SURGEONS STRL SZ 6.5 (GLOVE) ×1
GLOVE BIOGEL M STRL SZ7.5 (GLOVE) ×3 IMPLANT
GLOVE BIOGEL PI IND STRL 8 (GLOVE) ×1 IMPLANT
GLOVE BIOGEL PI INDICATOR 8 (GLOVE) ×2
GLOVE ECLIPSE 7.0 STRL STRAW (GLOVE) ×3 IMPLANT
GOWN STRL REUS W/ TWL XL LVL3 (GOWN DISPOSABLE) ×2 IMPLANT
GOWN STRL REUS W/TWL XL LVL3 (GOWN DISPOSABLE) ×4
NEEDLE HYPO 25X1 1.5 SAFETY (NEEDLE) ×3 IMPLANT
NEEDLE SPNL 18GX3.5 QUINCKE PK (NEEDLE) IMPLANT
PACK BASIN DAY SURGERY FS (CUSTOM PROCEDURE TRAY) ×3 IMPLANT
PEN SKIN MARKING BROAD TIP (MISCELLANEOUS) ×3 IMPLANT
PENCIL BUTTON HOLSTER BLD 10FT (ELECTRODE) ×3 IMPLANT
SHEET MEDIUM DRAPE 40X70 STRL (DRAPES) ×3 IMPLANT
SHEETING SILICONE GEL EPI DERM (MISCELLANEOUS) IMPLANT
SHEILD EYE MED CORNL SHD 22X21 (OPHTHALMIC RELATED) ×3
SHIELD EYE MED CORNL SHD 22X21 (OPHTHALMIC RELATED) ×1 IMPLANT
SLEEVE SCD COMPRESS KNEE MED (MISCELLANEOUS) ×3 IMPLANT
SPONGE GAUZE 4X4 12PLY STER LF (GAUZE/BANDAGES/DRESSINGS) IMPLANT
SPONGE LAP 18X18 X RAY DECT (DISPOSABLE) ×3 IMPLANT
STAPLER VISISTAT 35W (STAPLE) IMPLANT
STOCKINETTE 4X48 STRL (DRAPES) IMPLANT
STRIP CLOSURE SKIN 1/2X4 (GAUZE/BANDAGES/DRESSINGS) IMPLANT
SUCTION FRAZIER TIP 10 FR DISP (SUCTIONS) ×3 IMPLANT
SUT ETHILON 3 0 PS 1 (SUTURE) IMPLANT
SUT MNCRL AB 3-0 PS2 18 (SUTURE) IMPLANT
SUT MON AB 2-0 CT1 36 (SUTURE) IMPLANT
SUT PROLENE 4 0 P 3 18 (SUTURE) IMPLANT
SUT PROLENE 4 0 PS 2 18 (SUTURE) IMPLANT
SUT SILK 4 0 P 3 (SUTURE) ×3 IMPLANT
SYR 20CC LL (SYRINGE) IMPLANT
SYR 50ML LL SCALE MARK (SYRINGE) IMPLANT
SYR CONTROL 10ML LL (SYRINGE) ×3 IMPLANT
TAPE HYPAFIX 6 X30' (GAUZE/BANDAGES/DRESSINGS)
TAPE HYPAFIX 6X30 (GAUZE/BANDAGES/DRESSINGS) IMPLANT
TOWEL OR 17X24 6PK STRL BLUE (TOWEL DISPOSABLE) ×6 IMPLANT
TRAY DSU PREP LF (CUSTOM PROCEDURE TRAY) ×3 IMPLANT
TUBE CONNECTING 20'X1/4 (TUBING) ×1
TUBE CONNECTING 20X1/4 (TUBING) ×2 IMPLANT
UNDERPAD 30X30 (UNDERPADS AND DIAPERS) IMPLANT
VAC PENCILS W/TUBING CLEAR (MISCELLANEOUS) IMPLANT
YANKAUER SUCT BULB TIP NO VENT (SUCTIONS) ×3 IMPLANT

## 2015-08-11 NOTE — Transfer of Care (Signed)
Immediate Anesthesia Transfer of Care Note  Patient: Kaylee Ramos  Procedure(s) Performed: Procedure(s) with comments: EXCISION MASS RIGHT UPPER LID AND PLASTIC CLOSURE (Right) - right upper eyelid  Patient Location: PACU  Anesthesia Type:General  Level of Consciousness: awake and sedated  Airway & Oxygen Therapy: Patient Spontanous Breathing and Patient connected to face mask oxygen  Post-op Assessment: Report given to RN and Post -op Vital signs reviewed and stable  Post vital signs: Reviewed and stable  Last Vitals:  Filed Vitals:   08/11/15 1031  BP: 86/55  Pulse: 84  Temp: 36.6 C  Resp: 18    Complications: No apparent anesthesia complications

## 2015-08-11 NOTE — Anesthesia Postprocedure Evaluation (Signed)
Anesthesia Post Note  Patient: Kaylee Ramos  Procedure(s) Performed: Procedure(s) (LRB): EXCISION MASS RIGHT UPPER LID AND PLASTIC CLOSURE (Right)  Patient location during evaluation: PACU Anesthesia Type: General Level of consciousness: awake and alert and patient cooperative Pain management: pain level controlled Vital Signs Assessment: post-procedure vital signs reviewed and stable Respiratory status: spontaneous breathing and respiratory function stable Cardiovascular status: stable Anesthetic complications: no    Last Vitals:  Filed Vitals:   08/11/15 1215 08/11/15 1230  BP: 167/78   Pulse: 74 70  Temp:    Resp: 21 17    Last Pain:  Filed Vitals:   08/11/15 1241  PainSc: 2                  [name]

## 2015-08-11 NOTE — Discharge Instructions (Signed)
Activity (include date of return to work if known) °As tolerated: NO showers °NO driving °No heavy activities ° °Diet:regular No restrictions: ° °Wound Care: Keep dressing clean & dry ° °Do not change dressings ° °Special Instructions: ° ° ° °Call Doctor if any unusual problems occur such as pain, excessive °Bleeding, unrelieved Nausea/vomiting, Fever &/or chills ° ° °When lying down, keep head elevated on 2-3 pillows or back-rest °Follow-up appointment: Please call the office. ° °The patient received discharge instruction from:___________________________________________ ° ° °Patient signature ________________________________________ / Date___________ ° ° ° °Signature of individual providing instructions/ Date________________           ° ° ° ° °Post Anesthesia Home Care Instructions ° °Activity: °Get plenty of rest for the remainder of the day. A responsible adult should stay with you for 24 hours following the procedure.  °For the next 24 hours, DO NOT: °-Drive a car °-Operate machinery °-Drink alcoholic beverages °-Take any medication unless instructed by your physician °-Make any legal decisions or sign important papers. ° °Meals: °Start with liquid foods such as gelatin or soup. Progress to regular foods as tolerated. Avoid greasy, spicy, heavy foods. If nausea and/or vomiting occur, drink only clear liquids until the nausea and/or vomiting subsides. Call your physician if vomiting continues. ° °Special Instructions/Symptoms: °Your throat may feel dry or sore from the anesthesia or the breathing tube placed in your throat during surgery. If this causes discomfort, gargle with warm salt water. The discomfort should disappear within 24 hours. ° °If you had a scopolamine patch placed behind your ear for the management of post- operative nausea and/or vomiting: ° °1. The medication in the patch is effective for 72 hours, after which it should be removed.  Wrap patch in a tissue and discard in the trash. Wash hands  thoroughly with soap and water. °2. You may remove the patch earlier than 72 hours if you experience unpleasant side effects which may include dry mouth, dizziness or visual disturbances. °3. Avoid touching the patch. Wash your hands with soap and water after contact with the patch. °  °   °

## 2015-08-11 NOTE — Brief Op Note (Signed)
08/11/2015  11:36 AM  PATIENT:  Kaylee Ramos  64 y.o. female  PRE-OPERATIVE DIAGNOSIS:  mass right upper eye lid  POST-OPERATIVE DIAGNOSIS:  mass right upper eye lid  PROCEDURE:  Procedure(s) with comments: EXCISION MASS RIGHT UPPER LID AND PLASTIC CLOSURE (Right) - right upper eyelid  SURGEON:  Surgeon(s) and Role:    * Louisa SecondGerald Damyon Mullane, MD - Primary  PHYSICIAN ASSISTANT:   ASSISTANTS: none   ANESTHESIA:   general  EBL:  Total I/O In: 600 [I.V.:600] Out: -   BLOOD ADMINISTERED:none  DRAINS: none   LOCAL MEDICATIONS USED:  LIDOCAINE   SPECIMEN:  Excision  DISPOSITION OF SPECIMEN:  PATHOLOGY  COUNTS:  YES  TOURNIQUET:  * No tourniquets in log *  DICTATION: .Other Dictation: Dictation Number [id number]  PLAN OF CARE: Discharge to home after PACU  PATIENT DISPOSITION:  PACU - hemodynamically stable.   Delay start of Pharmacological VTE agent (>24hrs) due to surgical blood loss or risk of bleeding: yes

## 2015-08-11 NOTE — H&P (Signed)
Kaylee Ramos is an 64 y.o. female.   Chief Complaint: Enlarging mass right upper lid HPI: Increased growthof mass causing pressure pain and discomfort  Past Medical History  Diagnosis Date  . Diabetes mellitus without complication ([place])   . Hypertension   . Stroke [facility]     History reviewed. No pertinent past surgical history.  Family History  Problem Relation Age of Onset  . Seizures Daughter    Social History:  reports that she quit smoking about 5 months ago. Her smoking use included Cigarettes. She smoked 0.10 packs per day. She does not have any smokeless tobacco history on file. She reports that she does not drink alcohol or use illicit drugs.  Allergies: No Known Allergies  Medications Prior to Admission  Medication Sig Dispense Refill  . ACCU-CHEK FASTCLIX LANCETS MISC Use as directed 102 each prn  . baclofen (LIORESAL) 10 MG tablet Take 1 tablet (10 mg total) by mouth 3 (three) times daily. 30 each 0  . Blood Glucose Monitoring Suppl (ACCU-CHEK NANO SMARTVIEW) W/DEVICE KIT Use as directed 1 kit 0  . glipiZIDE (GLUCOTROL) 5 MG tablet Take 1 tablet (5 mg total) by mouth 2 (two) times daily before a meal. 180 tablet 3  . glucose blood (ACCU-CHEK SMARTVIEW) test strip Check sugar once daily 50 each prn  . metFORMIN (GLUCOPHAGE) 1000 MG tablet Take 1 tablet (1,000 mg total) by mouth 2 (two) times daily with a meal. 180 tablet 3    No results found for this or any previous visit (from the past 48 hour(s)). No results found.  Review of Systems  Constitutional: Negative.   HENT: Negative.   Eyes: Positive for pain.  Respiratory: Negative.   Cardiovascular: Negative.   Gastrointestinal: Negative.   Genitourinary: Negative.   Musculoskeletal: Negative.   Skin: Negative.   Neurological: Negative.   Endo/Heme/Allergies: Negative.   Psychiatric/Behavioral: Negative.     Height _0  (1.473 m), weight 40.37 kg (89 lb). Physical Exam   Assessment/Plan Mass right  upper lid for excision and plastic reconstruction  Chanay Nugent L 08/11/2015, 10:29 AM

## 2015-08-11 NOTE — Anesthesia Preprocedure Evaluation (Signed)
Anesthesia Evaluation  Patient identified by MRN, date of birth, ID band Patient awake    Reviewed: Allergy & Precautions, NPO status , Patient's Chart, lab work & pertinent test results  Airway Mallampati: II   Neck ROM: full    Dental   Pulmonary former smoker,    breath sounds clear to auscultation       Cardiovascular hypertension,  Rhythm:regular Rate:Normal     Neuro/Psych CVA    GI/Hepatic   Endo/Other  diabetes, Type 2  Renal/GU      Musculoskeletal   Abdominal   Peds  Hematology   Anesthesia Other Findings   Reproductive/Obstetrics                             Anesthesia Physical Anesthesia Plan  ASA: III  Anesthesia Plan: General   Post-op Pain Management:    Induction: Intravenous  Airway Management Planned: Oral ETT  Additional Equipment:   Intra-op Plan:   Post-operative Plan: Extubation in OR  Informed Consent: I have reviewed the patients History and Physical, chart, labs and discussed the procedure including the risks, benefits and alternatives for the proposed anesthesia with the patient or authorized representative who has indicated his/her understanding and acceptance.     Plan Discussed with: CRNA, Anesthesiologist and Surgeon  Anesthesia Plan Comments:         Anesthesia Quick Evaluation

## 2015-08-11 NOTE — Anesthesia Procedure Notes (Signed)
Procedure Name: LMA Insertion Performed by: York GricePEARSON, Izzabella Besse W Pre-anesthesia Checklist: Patient identified, Emergency Drugs available, Suction available and Patient being monitored Patient Re-evaluated:Patient Re-evaluated prior to inductionOxygen Delivery Method: Circle System Utilized Preoxygenation: Pre-oxygenation with 100% oxygen Intubation Type: IV induction Ventilation: Mask ventilation without difficulty LMA: LMA inserted LMA Size: 4.0 and 3.0 Number of attempts: 1 Airway Equipment and Method: Bite block Placement Confirmation: positive ETCO2 Tube secured with: Tape Dental Injury: Teeth and Oropharynx as per pre-operative assessment

## 2015-08-12 ENCOUNTER — Encounter (HOSPITAL_BASED_OUTPATIENT_CLINIC_OR_DEPARTMENT_OTHER): Payer: Self-pay | Admitting: Specialist

## 2015-08-12 NOTE — Op Note (Signed)
NAMJulien Girt:  [place], MAN MAYA             ACCOUNT NO.:  [id number]  MEDICAL RECORD NO.:  [id number]  LOCATION:                               FACILITY:  [facility]  PHYSICIAN:  Earvin HansenGerald L. Loyal Rudy, M.D.DATE OF BIRTH:  1951/03/30  DATE OF PROCEDURE:  08/11/2015 DATE OF DISCHARGE:  08/11/2015                              OPERATIVE REPORT   INDICATIONS:  A 64 year old lady with a larger mass involving her right upper eyelid, causing pain, discomfort and pressure on the eye, measured approximately 1 x 1.5 cm.  PROCEDURES DONE:  Excision, plastic closure, delicate dissection.  ANESTHESIA:  General.  DESCRIPTION OF PROCEDURE:  Preoperatively, the patient underwent drawings of the area, then underwent general anesthesia, intubated orally, prep was done to the face areas with Betadine solution, walled off with sterile towels and drapes so as to make a sterile field after the eye was protected with Lacri-Lube as well as an eye shield.  A 0.5% Xylocaine with epinephrine was injected locally.  Total of 3 mL 1:200,000 concentration was allowed to sit up and the mass was removed in a large elliptical incision.  Then using delicate scissors, I was able to dissect the mass all the way away from the conjunctiva, one small area approximately 1 mm, which I repaired with a 6-0 silk not away from the eye.  After dissection of the area, I was able to free up the tissue approximately 1.5 cm above this to allow closure with individual sutures of 6-0 silk in a flap closure.  The wounds were cleansed. Sterile dressing was applied to all the areas.  The eye shield was removed.  Irrigation was done in the eye with ophthalmic ointment.  She tolerated all the procedures very well.  Specimen will be sent to Pathology for examination.     Yaakov GuthrieGerald L. Shon Houghruesdale, M.D.     Cathie HoopsGLT/MEDQ  D:  08/11/2015  T:  08/12/2015  Job:  [id number]

## 2015-08-27 ENCOUNTER — Other Ambulatory Visit: Payer: Self-pay | Admitting: Family Medicine

## 2015-08-27 NOTE — Telephone Encounter (Signed)
Needs refill on diabetes meds. Not able to say which one [facility] [address]

## 2015-08-28 MED ORDER — GLIPIZIDE 5 MG PO TABS: 5.0000 mg | ORAL_TABLET | Freq: Two times a day (BID) | ORAL | Status: DC

## 2015-08-28 MED ORDER — METFORMIN HCL 1000 MG PO TABS: 1000.0000 mg | ORAL_TABLET | Freq: Two times a day (BID) | ORAL | Status: DC

## 2015-10-01 ENCOUNTER — Encounter: Payer: Self-pay | Admitting: Family Medicine

## 2015-10-01 NOTE — Progress Notes (Signed)
PCS renewal forms received, reviewed and filled out. Left in fax pile. -Dr. Waynetta Sandy

## 2015-10-23 ENCOUNTER — Telehealth: Payer: Self-pay

## 2015-10-23 NOTE — Telephone Encounter (Signed)
error 

## 2016-01-16 ENCOUNTER — Ambulatory Visit (INDEPENDENT_AMBULATORY_CARE_PROVIDER_SITE_OTHER): Payer: Medicaid Other | Admitting: Family Medicine

## 2016-01-16 ENCOUNTER — Other Ambulatory Visit: Payer: Self-pay | Admitting: Family Medicine

## 2016-01-16 ENCOUNTER — Encounter: Payer: Self-pay | Admitting: Family Medicine

## 2016-01-16 VITALS — BP 128/62 | HR 107 | Temp 97.9°F | Ht <= 58 in | Wt 89.2 lb

## 2016-01-16 DIAGNOSIS — Z1159 Encounter for screening for other viral diseases: Secondary | ICD-10-CM

## 2016-01-16 DIAGNOSIS — Z23 Encounter for immunization: Secondary | ICD-10-CM

## 2016-01-16 DIAGNOSIS — IMO0001 Reserved for inherently not codable concepts without codable children: Secondary | ICD-10-CM

## 2016-01-16 DIAGNOSIS — Z1239 Encounter for other screening for malignant neoplasm of breast: Secondary | ICD-10-CM

## 2016-01-16 DIAGNOSIS — I639 Cerebral infarction, unspecified: Secondary | ICD-10-CM | POA: Diagnosis not present

## 2016-01-16 DIAGNOSIS — R630 Anorexia: Secondary | ICD-10-CM

## 2016-01-16 DIAGNOSIS — Z0000 Encounter for general adult medical examination without abnormal findings: Secondary | ICD-10-CM

## 2016-01-16 DIAGNOSIS — E1165 Type 2 diabetes mellitus with hyperglycemia: Secondary | ICD-10-CM

## 2016-01-16 LAB — LIPID PANEL
Cholesterol: 213 mg/dL — ABNORMAL HIGH (ref 125–200)
HDL: 54 mg/dL (ref >=46)
LDL CALC: 120 mg/dL (ref <130)
TRIGLYCERIDES: 196 mg/dL — AB (ref <150)
Total CHOL/HDL Ratio: 3.9 Ratio (ref <=5.0)
VLDL: 39 mg/dL — AB (ref <30)

## 2016-01-16 MED ORDER — GLIPIZIDE 5 MG PO TABS: 5.0000 mg | ORAL_TABLET | Freq: Two times a day (BID) | ORAL | Status: DC

## 2016-01-16 MED ORDER — TETANUS-DIPHTH-ACELL PERTUSSIS 5-2.5-18.5 LF-MCG/0.5 IM SUSP: 0.5000 mL | Freq: Once | INTRAMUSCULAR | Status: DC

## 2016-01-16 MED ORDER — ASPIRIN EC 81 MG PO TBEC: 81.0000 mg | DELAYED_RELEASE_TABLET | Freq: Every day | ORAL | Status: DC

## 2016-01-16 MED ORDER — GLUCOSE BLOOD VI STRP: ORAL_STRIP | Status: DC

## 2016-01-16 MED ORDER — METFORMIN HCL 1000 MG PO TABS: 1000.0000 mg | ORAL_TABLET | Freq: Two times a day (BID) | ORAL | Status: DC

## 2016-01-16 NOTE — Assessment & Plan Note (Addendum)
Mammogram scheduled today as well as HCV screen, first PCV, lipid panel. Still need to plan on scheduling colonoscopy however this was not done today because of transportation concerns and felt would be best addressed separately. Tdap sent to pharmacy.

## 2016-01-16 NOTE — Assessment & Plan Note (Signed)
Did not get A1c done today. By report she seems to be doing quite well, fasting sugars in the 90-150 range. On glipizide and metformin. Follow up 3 months.

## 2016-01-16 NOTE — Patient Instructions (Signed)
Refills sent to the pharmacy.  Also a new medicine is going to be started: ASPIRIN take 1 tablet once a day, this is to help prevent stroke.

## 2016-01-16 NOTE — Progress Notes (Signed)
   Subjective:    Patient ID: Man Lubertha SayresMaya Dils, female    DOB: 02/18/1951, 65 y.o.   MRN: [id number]  HPI  Interpreter Audery AmelHari #[id number]  Accompanied by her niece and daughter who help with the history  CC: medication refill  # Diabetes:  Doing "well"  CBG typically around 10am: 150 this AM. 90-120 typically. Highest number 200.    Eye exam: last December, not sure who they saw ROS: no polyuria, no polydipsia, no CP, no SOB  # Healthcare maintenance  Due for hep C, mammogram, colonoscopy  Due for Tdap, zostavax  # Appetite:  Past 2 weeks has not been eating as much, loss of appetite  No weight loss  No diarrhea, no constipation, no vomiting  Has had some mild URI/cold symptoms in the past month, seems to be getting better  Social Hx: former smoker, quit 2016  Review of Systems   See HPI for ROS.   Past medical history, surgical, family, and social history reviewed and updated in the EMR as appropriate. Objective:  BP 128/62 mmHg  Pulse 107  Temp(Src) 97.9 F (36.6 C) (Oral)  Ht 4\' 10"  (1.473 m)  Wt 89 lb 3.2 oz (40.461 kg)  BMI 18.65 kg/m2  SpO2 97% Vitals and nursing note reviewed  General: no apparent distress CV: normal rate, regular rhythm (not tachycardic), no murmurs, rubs or gallop.  Resp: clear to auscultation bilaterally, normal effort Abdomen: soft, thin, nontender, nondistended Extremities: no LE edema. DM foot exam done and documented in QM, no abnormalities  Assessment & Plan:  Type 2 diabetes mellitus, uncontrolled Did not get A1c done today. By report she seems to be doing quite well, fasting sugars in the 90-150 range. On glipizide and metformin. Follow up 3 months.  Healthcare maintenance Mammogram scheduled today as well as HCV screen, first PCV, lipid panel. Still need to plan on scheduling colonoscopy however this was not done today because of transportation concerns and felt would be best addressed separately. Tdap sent to  pharmacy.  CVA (cerebral infarction) Patient not reporting taking aspirin. Discussed starting this and rx was sent in.  Poor appetite Family at bedside wanting to start medicine. Discussed with only 2 week history, no weight loss, no red flags would recommend observation for 2-4 more weeks and return to clinic if still having issues to discuss treatment (would recommend starting remeron).

## 2016-01-16 NOTE — Assessment & Plan Note (Signed)
Patient not reporting taking aspirin. Discussed starting this and rx was sent in.

## 2016-01-17 LAB — MICROALBUMIN, URINE: Microalb, Ur: 0.3 mg/dL

## 2016-01-17 LAB — HEPATITIS C ANTIBODY: HCV Ab: NEGATIVE

## 2016-01-23 ENCOUNTER — Ambulatory Visit
Admission: RE | Admit: 2016-01-23 | Discharge: 2016-01-23 | Disposition: A | Discharge status: Home / Self Care | Payer: Medicaid Other | Source: Ambulatory Visit | Attending: Family Medicine | Admitting: Family Medicine

## 2016-01-23 DIAGNOSIS — Z1239 Encounter for other screening for malignant neoplasm of breast: Secondary | ICD-10-CM

## 2016-01-27 ENCOUNTER — Other Ambulatory Visit: Payer: Self-pay | Admitting: Family Medicine

## 2016-01-27 DIAGNOSIS — R928 Other abnormal and inconclusive findings on diagnostic imaging of breast: Secondary | ICD-10-CM

## 2016-02-04 ENCOUNTER — Other Ambulatory Visit: Payer: Medicaid Other

## 2016-04-09 ENCOUNTER — Ambulatory Visit
Admission: RE | Admit: 2016-04-09 | Discharge: 2016-04-09 | Disposition: A | Discharge status: Home / Self Care | Payer: Medicaid Other | Source: Ambulatory Visit | Attending: Family Medicine | Admitting: Family Medicine

## 2016-04-09 DIAGNOSIS — R928 Other abnormal and inconclusive findings on diagnostic imaging of breast: Secondary | ICD-10-CM

## 2016-04-12 ENCOUNTER — Ambulatory Visit (INDEPENDENT_AMBULATORY_CARE_PROVIDER_SITE_OTHER): Payer: Medicaid Other | Admitting: Internal Medicine

## 2016-04-12 VITALS — BP 138/53 | HR 80 | Temp 98.0°F | Wt 93.0 lb

## 2016-04-12 DIAGNOSIS — H5711 Ocular pain, right eye: Secondary | ICD-10-CM | POA: Diagnosis not present

## 2016-04-12 HISTORY — DX: Ocular pain, right eye: H57.11

## 2016-04-12 NOTE — Patient Instructions (Addendum)
I want you to follow up with your eye doctor with the week to have a look at your eye. It is possibly that this a dry eye. You can try using Visine for your eyes in the mean time. You should receive a call for a referral within the week. If patient develops severe eye pain, nausea or vomiting, loss of vision go to ED to be evaluated.

## 2016-04-12 NOTE — Assessment & Plan Note (Signed)
Exam currently normal, no erythema, pupils round and reactive, visual acuity 70/20 in both eyes bilaterally. Differential includes acute angle glaucoma versus dry eye. Due to similar visual acuity in both eyes unlike emergent issue, likely some worsening of vision due to age. Patient's eye currently not erythematous therefore less likely to be a corneal abrasion. Allergic conjunctivitis considered however patient without seasonal allergies as well as pain and irritation being specific to one eye. Chronic glaucoma and retinal detachment are usually painless.  - Provided patient with over the counter Visine drops for possibly dry eye  - Follow up with referral to opthalmology in the next week  - Discussed with Dr. Deirdre Priesthambliss

## 2016-04-12 NOTE — Progress Notes (Signed)
   [facility] Kaylee CharsAsiyah Indica Marcott, MD Phone: [phone]  Reason For Visit: SDA for pain right eye   #1 week history of right eye pain. Patient states that over the last week she has been having episodes of watery eyes and eye pain usually in the morning or evening. Indicates that sometimes her eyes erythematous. She states that at times it itches. She also indicates that at times it feels as if there is something inside of the eye irritating it. She endorses blurred vision when she's having the pain as well as photophobia at that time. She indicates that sometimes she has sparks in her eye during these episodes. Patient was recently seen for eyelid surgery in December 2016. However indicates that following the surgery she did not have any problems with her eyes. Denies having watery eyes or allergies in past. No hx of trauma. Denies any swelling. Does not wear contact lens.   Past Medical History Reviewed problem list.  Medications- reviewed and updated No additions to family history Social history- patient is a non-smoker  Objective: BP (!) 138/53 (BP Location: Right Arm, Patient Position: Sitting, Cuff Size: Normal)   Pulse 80   Temp 98 F (36.7 C) (Oral)   Wt 93 lb (42.2 kg)   BMI 19.44 kg/m  Gen: NAD, alert, cooperative with exam HEENT:  EOMI, PERRL, able to see blood vessels on funduscopic exam, unable to reach the optic disc  Neck: FROM, supple  Assessment/Plan: See problem based a/p  Acute right eye pain Exam currently normal, no erythema, pupils round and reactive, visual acuity 70/20 in both eyes bilaterally. Differential includes acute angle glaucoma versus dry eye. Due to similar visual acuity in both eyes unlike emergent issue, likely some worsening of vision due to age. Patient's eye currently not erythematous therefore less likely to be a corneal abrasion. Allergic conjunctivitis considered however patient without seasonal allergies as well as pain and  irritation being specific to one eye. Chronic glaucoma and retinal detachment are usually painless.  - Provided patient with over the counter Visine drops for possibly dry eye  - Follow up with referral to opthalmology in the next week  - Discussed with Dr. Deirdre Priesthambliss

## 2016-05-05 ENCOUNTER — Encounter: Payer: Self-pay | Admitting: Internal Medicine

## 2016-05-05 ENCOUNTER — Ambulatory Visit (INDEPENDENT_AMBULATORY_CARE_PROVIDER_SITE_OTHER): Payer: Medicaid Other | Admitting: Internal Medicine

## 2016-05-05 ENCOUNTER — Other Ambulatory Visit: Payer: Self-pay | Admitting: Internal Medicine

## 2016-05-05 VITALS — BP 92/60 | HR 89 | Temp 98.2°F | Wt 92.0 lb

## 2016-05-05 DIAGNOSIS — IMO0001 Reserved for inherently not codable concepts without codable children: Secondary | ICD-10-CM

## 2016-05-05 DIAGNOSIS — E1165 Type 2 diabetes mellitus with hyperglycemia: Secondary | ICD-10-CM | POA: Diagnosis not present

## 2016-05-05 LAB — COMPLETE METABOLIC PANEL WITH GFR
ALT: 26 U/L (ref 6–29)
AST: 25 U/L (ref 10–35)
Albumin: 3.9 g/dL (ref 3.6–5.1)
Alkaline Phosphatase: 122 U/L (ref 33–130)
BILIRUBIN TOTAL: 0.3 mg/dL (ref 0.2–1.2)
BUN: 9 mg/dL (ref 7–25)
CALCIUM: 9.7 mg/dL (ref 8.6–10.4)
CHLORIDE: 102 mmol/L (ref 98–110)
CO2: 27 mmol/L (ref 20–31)
CREATININE: 0.56 mg/dL (ref 0.50–0.99)
GFR, Est African American: >89 mL/min (ref >=60)
Glucose, Bld: 132 mg/dL — ABNORMAL HIGH (ref 65–99)
Potassium: 4.4 mmol/L (ref 3.5–5.3)
Sodium: 137 mmol/L (ref 135–146)
Total Protein: 7.9 g/dL (ref 6.1–8.1)

## 2016-05-05 LAB — POCT GLYCOSYLATED HEMOGLOBIN (HGB A1C): HEMOGLOBIN A1C: 10.9

## 2016-05-05 MED ORDER — GLIPIZIDE 5 MG PO TABS: 7.5000 mg | ORAL_TABLET | Freq: Two times a day (BID) | ORAL | 1 refills | Status: DC

## 2016-05-05 MED ORDER — ATORVASTATIN CALCIUM 20 MG PO TABS: 20.0000 mg | ORAL_TABLET | Freq: Every day | ORAL | 0 refills | Status: DC

## 2016-05-05 MED ORDER — CANAGLIFLOZIN 100 MG PO TABS: 100.0000 mg | ORAL_TABLET | Freq: Every day | ORAL | 0 refills | Status: DC

## 2016-05-05 NOTE — Progress Notes (Signed)
poc

## 2016-05-05 NOTE — Progress Notes (Signed)
   [facility] Phone: [phone]   Date of Visit: 05/05/2016   HPI: Nepali interpreter used: [phone]  Patient is present with daughter and another family member  DM:  - daughter reports patient is taking Metformin 1000mg  BID and Glipizide 5mg  BID - checks cbgs twice a day: in the AM sometimes before and sometimes after breakfast; and in the evening after dinner. Reports mainly in the 200s (200-201).  No low cbgs  - last A1c 7.5 in 06/2015 - A1c today 10.9 - up to date on foot exam and opthalmology exam   Of note, blood pressure is lower than prior recorded BP in chart. Patient denies lightheadedness or dizziness, and is feeling fine like her usual self.   ROS: See HPI.  PMFSH:  DM2  PHYSICAL EXAM: BP 92/60   Pulse 89   Temp 98.2 F (36.8 C) (Oral)   Wt 92 lb (41.7 kg)   SpO2 94%   BMI 19.23 kg/m  GEN: NAD CV: RRR, no murmurs, rubs, or gallops PULM: CTAB, normal effort EXTR: No lower extremity edema or calf tenderness NEURO: Awake, alert, no focal deficits grossly   ASSESSMENT/PLAN:  Type 2 diabetes mellitus, uncontrolled Uncontrolled. A1c 10.9 from 7.5 in 06/2016. Is taking Metformin 1000mg  BID and Glipizide 5mg . Initially discussed starting Invokana. However, with her blood pressure there is concern she may get dehydrated resulting in lower blood pressure. Therefore called pharmacy and cancelled Rx. Also called daughter (with interpreter services) to discuss this change. Instead, will increase Glipizide to 7.5mg  BID. Discussed how to monitor for hypoglycemia and how to treat hypoglycemia. Also daughter to keep a record of cbgs. Asked daughter to write down fasting AM cbg and 2 hour post prandial after dinner. Lipid panel completed in 01/2016. Patient would benefit from Statin. Started Atorvastatin 20mg  daily. CMP today.  Follow up in 3 months.    Palma HolterKanishka G Gunadasa, MD PGY 2 [facility]

## 2016-05-05 NOTE — Assessment & Plan Note (Addendum)
Uncontrolled. A1c 10.9 from 7.5 in 06/2016. Is taking Metformin 1000mg  BID and Glipizide 5mg . Initially discussed starting Invokana. However, with her blood pressure there is concern she may get dehydrated resulting in lower blood pressure. Therefore called pharmacy and cancelled Rx. Also called daughter (with interpreter services) to discuss this change. Instead, will increase Glipizide to 7.5mg  BID. Discussed how to monitor for hypoglycemia and how to treat hypoglycemia. Also daughter to keep a record of cbgs. Asked daughter to write down fasting AM cbg and 2 hour post prandial after dinner. Lipid panel completed in 01/2016. Patient would benefit from Statin. Started Atorvastatin 20mg  daily. CMP today.  Follow up in 3 months.

## 2016-05-16 ENCOUNTER — Ambulatory Visit (INDEPENDENT_AMBULATORY_CARE_PROVIDER_SITE_OTHER): Payer: Medicaid Other

## 2016-05-16 ENCOUNTER — Ambulatory Visit (HOSPITAL_COMMUNITY)
Admission: EM | Admit: 2016-05-16 | Discharge: 2016-05-16 | Disposition: A | Discharge status: Home / Self Care | Payer: Medicaid Other | Attending: Family Medicine | Admitting: Family Medicine

## 2016-05-16 ENCOUNTER — Encounter (HOSPITAL_COMMUNITY): Payer: Self-pay | Admitting: Emergency Medicine

## 2016-05-16 DIAGNOSIS — S5001XA Contusion of right elbow, initial encounter: Secondary | ICD-10-CM | POA: Diagnosis not present

## 2016-05-16 DIAGNOSIS — S39012A Strain of muscle, fascia and tendon of lower back, initial encounter: Secondary | ICD-10-CM | POA: Diagnosis not present

## 2016-05-16 DIAGNOSIS — M791 Myalgia: Secondary | ICD-10-CM | POA: Diagnosis not present

## 2016-05-16 DIAGNOSIS — R079 Chest pain, unspecified: Secondary | ICD-10-CM | POA: Diagnosis not present

## 2016-05-16 DIAGNOSIS — M7918 Myalgia, other site: Secondary | ICD-10-CM
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
## 2016-06-15 ENCOUNTER — Encounter: Payer: Self-pay | Admitting: Internal Medicine

## 2016-06-15 ENCOUNTER — Ambulatory Visit (INDEPENDENT_AMBULATORY_CARE_PROVIDER_SITE_OTHER): Payer: Medicaid Other | Admitting: Internal Medicine

## 2016-06-15 ENCOUNTER — Ambulatory Visit (HOSPITAL_COMMUNITY)
Admission: RE | Admit: 2016-06-15 | Discharge: 2016-06-15 | Disposition: A | Discharge status: Home / Self Care | Payer: Medicaid Other | Source: Ambulatory Visit | Attending: Family Medicine | Admitting: Family Medicine

## 2016-06-15 VITALS — BP 84/61 | HR 100 | Temp 98.1°F | Wt 93.0 lb

## 2016-06-15 DIAGNOSIS — R059 Cough, unspecified: Secondary | ICD-10-CM

## 2016-06-15 DIAGNOSIS — R05 Cough: Secondary | ICD-10-CM | POA: Insufficient documentation

## 2016-06-15 DIAGNOSIS — I700 Atherosclerosis of aorta: Secondary | ICD-10-CM | POA: Insufficient documentation

## 2016-06-15 DIAGNOSIS — R0602 Shortness of breath: Secondary | ICD-10-CM | POA: Insufficient documentation

## 2016-06-15 DIAGNOSIS — Z23 Encounter for immunization: Secondary | ICD-10-CM

## 2016-06-15 MED ORDER — ALBUTEROL SULFATE HFA 108 (90 BASE) MCG/ACT IN AERS: 2.0000 | INHALATION_SPRAY | RESPIRATORY_TRACT | 0 refills | Status: DC | PRN

## 2016-06-15 NOTE — Patient Instructions (Addendum)
Follow up in 2 weeks for diabetes. Please bring a record of your sugar levels.

## 2016-06-20 NOTE — Progress Notes (Signed)
   [facility] Phone: [phone]   Date of Visit: 06/21/2016   HPI: Nepali interpreter used [phone] Cough/Wheezing: - CXR obtained from last visit without consolidation - reports of resolved symptoms; rarely has cough now - was usuingalbuterol q 4 hours but now PRN and rarely needs  - denies shortness of breath; occasional cough - denies lightheadedness/dizziness  DM:  - A1c: 10.9 (04/2016) < 7.5 (06/2015)  - Medication: Glipizide 7.5BID, Metformin 1000mg  BID  - Up to date on foot exam and opthalmology exam  - reports symptoms of hypoglycemia in the AM after increased dose of Glipizide. AM cbgs in the 87-111. Reports not having hypoglycemia with 5mg  dose.    ROS: See HPI.  PMFSH:  PMH:  DM2 Hx CVA Tobacco Use   PHYSICAL EXAM: BP 90/62   Pulse (!) 104   Temp 98 F (36.7 C) (Oral)   Wt 94 lb (42.6 kg)   SpO2 93%   BMI 19.65 kg/m  GEN: NAD CV: RRR, no murmurs, rubs, or gallops, no JVD PULM: coarse breath sounds, no wheezing, normal effort EXTR: No lower extremity edema or calf tenderness PSYCH: Mood and affect euthymic, normal rate and volume of speech NEURO: Awake, alert, no focal deficits grossly, normal speech   ASSESSMENT/PLAN:  Cough/Wheezing: wheezing resolved. CXR from last visit without consolidation. Cough has improved - only use albuterol PRN - would benefit from PFTs after cough completely resolves. Will make sure to schedule this at next visit  Type 2 diabetes mellitus, uncontrolled Will decreased Glipizide back to 5mg  BID. Continue Metformin 1000mg  BID. Start Liraglutide 0.6mg  daily for one week. Follow up in a week to determine how patient is doing with new med prior to increasing dose.    Kaylee HolterKanishka G Fendi Meinhardt, MD PGY 2 [facility]

## 2016-06-21 ENCOUNTER — Other Ambulatory Visit: Payer: Self-pay | Admitting: Internal Medicine

## 2016-06-21 ENCOUNTER — Encounter: Payer: Self-pay | Admitting: Internal Medicine

## 2016-06-21 ENCOUNTER — Ambulatory Visit (INDEPENDENT_AMBULATORY_CARE_PROVIDER_SITE_OTHER): Payer: Medicaid Other | Admitting: Internal Medicine

## 2016-06-21 VITALS — BP 90/62 | HR 104 | Temp 98.0°F | Wt 94.0 lb

## 2016-06-21 DIAGNOSIS — R05 Cough: Secondary | ICD-10-CM | POA: Diagnosis not present

## 2016-06-21 DIAGNOSIS — E1165 Type 2 diabetes mellitus with hyperglycemia: Secondary | ICD-10-CM

## 2016-06-21 DIAGNOSIS — IMO0001 Reserved for inherently not codable concepts without codable children: Secondary | ICD-10-CM

## 2016-06-21 DIAGNOSIS — R059 Cough, unspecified: Secondary | ICD-10-CM

## 2016-06-21 MED ORDER — LIRAGLUTIDE 18 MG/3ML ~~LOC~~ SOPN: 0.6000 mg | PEN_INJECTOR | Freq: Every day | SUBCUTANEOUS | 0 refills | Status: DC

## 2016-06-21 MED ORDER — GLIPIZIDE 5 MG PO TABS: 5.0000 mg | ORAL_TABLET | Freq: Two times a day (BID) | ORAL | 0 refills | Status: DC

## 2016-06-21 MED ORDER — GLUCOSE BLOOD VI STRP: ORAL_STRIP | 6 refills | Status: DC

## 2016-06-21 MED ORDER — INSULIN PEN NEEDLE 31G X 8 MM MISC: 0 refills | Status: DC

## 2016-06-21 NOTE — Patient Instructions (Signed)
We started you on Victoza for your diabetes.  Please make a follow up appointment in 1 week.

## 2016-06-21 NOTE — Assessment & Plan Note (Signed)
Will decreased Glipizide back to 5mg  BID. Continue Metformin 1000mg  BID. Start Liraglutide 0.6mg  daily for one week. Follow up in a week to determine how patient is doing with new med prior to increasing dose.

## 2016-07-04 NOTE — Progress Notes (Signed)
   [facility] Phone: [phone]   Date of Visit: 07/05/2016   HPI:  DM2:  - A1c: 10.9 (04/2016) < 7.5 (06/2015)  - Medication: Glipizide 5mg BID, Metformin 1000mg  BID, Liraglutide - reports Liraglutide was too expensive to obtain.  - Up to date on foot exam and opthalmology exam  - CBG:  this AM 180. Has not been checking CBGs since they could not afford Liraglutide - has been improving diet; decrease sugar in diet  ROS: See HPI.  PMFSH:  PMH:  DM2 Hx CVA Tobacco Use   PHYSICAL EXAM: BP 97/61   Pulse 93   Temp 98 F (36.7 C) (Oral)   Ht 4\' 10"  (1.473 m)   Wt 94 lb 12.8 oz (43 kg)   SpO2 94%   BMI 19.81 kg/m  GEN: NAD CV: RRR, no murmurs, rubs, or gallops PULM: CTAB, normal effort SKIN: No rash or cyanosis; warm and well-perfused EXTR: No lower extremity edema or calf tenderness PSYCH: Mood and affect euthymic, normal rate and volume of speech NEURO: Awake, alert, no focal deficits grossly, normal speech  ASSESSMENT/PLAN:  Health maintenance:  - family to contemplate if they would like to screen for osteoporosis and colon cancer  Type 2 diabetes mellitus, uncontrolled Since patient's last A1c which had worsened, we have not been able to find a good regimen for patient. Initially attempted to increase Glipizide to 7.5mg  BID but had hypoglycemia. Then attempted to add Liraglutide, however this was not started due to cost. Would consider DPP Sitagliptin but side effect of hypoglycemia is reported on UpToDate especially with a sulfonylurea. Therefore, will refer to Dr. Raymondo BandKoval for pharmacy clinic for further assistance.   Concern for COPD:  Patient has a significant history of smoking and had a recent acute episode of wheezing. Acute episode improved/resolved with PRN Albuterol.  - would benefit from PFTs as well - referral to Dr. Raymondo BandKoval for PFT  Palma HolterKanishka G Ataya Murdy, MD PGY 2 [facility]

## 2016-07-05 ENCOUNTER — Ambulatory Visit (INDEPENDENT_AMBULATORY_CARE_PROVIDER_SITE_OTHER): Payer: Medicaid Other | Admitting: Internal Medicine

## 2016-07-05 VITALS — BP 97/61 | HR 93 | Temp 98.0°F | Ht <= 58 in | Wt 94.8 lb

## 2016-07-05 DIAGNOSIS — E1165 Type 2 diabetes mellitus with hyperglycemia: Secondary | ICD-10-CM | POA: Diagnosis not present

## 2016-07-05 DIAGNOSIS — E118 Type 2 diabetes mellitus with unspecified complications: Secondary | ICD-10-CM

## 2016-07-05 DIAGNOSIS — IMO0002 Reserved for concepts with insufficient information to code with codable children: Secondary | ICD-10-CM

## 2016-07-05 NOTE — Patient Instructions (Signed)
Please make an appointment with Dr. Raymondo BandKoval in pharmacy clinic for DM management and Pulmonary Function Test.

## 2016-07-06 NOTE — Assessment & Plan Note (Signed)
Since patient's last A1c which had worsened, we have not been able to find a good regimen for patient. Initially attempted to increase Glipizide to 7.5mg  BID but had hypoglycemia. Then attempted to add Liraglutide, however this was not started due to cost. Would consider DPP Sitagliptin but side effect of hypoglycemia is reported on UpToDate especially with a sulfonylurea. Therefore, will refer to Dr. Raymondo BandKoval for pharmacy clinic for further assistance.

## 2016-07-15 ENCOUNTER — Ambulatory Visit (INDEPENDENT_AMBULATORY_CARE_PROVIDER_SITE_OTHER): Payer: Medicaid Other | Admitting: Pharmacist

## 2016-07-15 ENCOUNTER — Encounter: Payer: Self-pay | Admitting: Pharmacist

## 2016-07-15 VITALS — Wt 96.0 lb

## 2016-07-15 DIAGNOSIS — J449 Chronic obstructive pulmonary disease, unspecified: Secondary | ICD-10-CM | POA: Diagnosis present

## 2016-07-15 DIAGNOSIS — E1165 Type 2 diabetes mellitus with hyperglycemia: Secondary | ICD-10-CM

## 2016-07-15 DIAGNOSIS — IMO0001 Reserved for inherently not codable concepts without codable children: Secondary | ICD-10-CM

## 2016-07-15 MED ORDER — TIOTROPIUM BROMIDE MONOHYDRATE 18 MCG IN CAPS: 18.0000 ug | ORAL_CAPSULE | Freq: Every day | RESPIRATORY_TRACT | 3 refills | Status: DC

## 2016-07-15 MED ORDER — ALBUTEROL SULFATE HFA 108 (90 BASE) MCG/ACT IN AERS: 2.0000 | INHALATION_SPRAY | RESPIRATORY_TRACT | 3 refills | Status: DC | PRN

## 2016-07-15 MED ORDER — METFORMIN HCL 1000 MG PO TABS: 1000.0000 mg | ORAL_TABLET | Freq: Two times a day (BID) | ORAL | 3 refills | Status: DC

## 2016-07-15 MED ORDER — ALBIGLUTIDE 30 MG ~~LOC~~ PEN: 30.0000 mg | PEN_INJECTOR | SUBCUTANEOUS | 1 refills | Status: DC

## 2016-07-15 MED ORDER — ATORVASTATIN CALCIUM 20 MG PO TABS: 20.0000 mg | ORAL_TABLET | Freq: Every day | ORAL | 0 refills | Status: DC

## 2016-07-15 NOTE — Assessment & Plan Note (Signed)
Spirometry evaluation reveals moderate obstructive lung disease corresponding to GOLD Classification B based  0 exacerbations in the past year and mMRC score of 2. Post bronchodilator revealed no significant improvement in FVC or FEV1. Continued albuterol as needed for shortness of breath.  Started Spiriva Handihaler 18 mcg 1 inhalation daily.  Explained differences in rescue and long acting inhalers. Educated patient and daughters on purpose, proper use, potential adverse effects. Reviewed results of pulmonary function tests.  Pt verbalized understanding of results and education.  Refilled albuterol.   Chronic tobacco abuse of 3 cigs per day in patient who is uninterested in quitting at this time.   Encouraged cessation.  Reassess in future visits.

## 2016-07-15 NOTE — Progress Notes (Signed)
S:    Patient arrives accompanied by her daughters which now assist taking care of her.  Patient was seen with the assistance of a Nepali interpretor. Presents for diabetes evaluation, education, and management and pulmonary function testing at the request of Dr Ottie GlazierGunadasa. Patient was referred on 07/05/16.  Patient was last seen by Primary Care Provider on 07/05/16.   Patient reports adherence with medications.  Current diabetes medications include: glipizide IR 5 mg daily, metformin 1000 mg BID.  Per her pharmacy Victoza required a prior authorization.   Patient reports one hypoglycemic event (CBG 67 mg/dL) on 16/06/9609/30/17 . Reports proper treatment with utilizing pieces of candy.    Patient reports nocturia 3x/night.   Smokes maximum of 3 cigarettes per day. Started smoking at age 65 (49 years).  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 [place].  Patient reports using albuterol a couple times per week but with the cold weather has been using albuterol more often because she is coughing more often.  She reports when walking with others her age she has to stop because of trouble breathing.   O:  Lab Results  Component Value Date   HGBA1C 10.9 05/05/2016   Vitals:   07/15/16 1534  Weight: 96 lb (43.5 kg)   Home fasting CBG: 155, 119, 143, 136, 150, 140, 141, 133, 165, 167, 257 2 hour post-prandial after dinner: 248, 160, 170, 160, 180, 150, 156, 218.  mMRC score= 2 See Documentation Flowsheet - CAT/COPD for complete symptom scoring.  See "scanned report" or Documentation Flowsheet (discrete results - PFTs) for  Spirometry results. Patient provided good effort while attempting spirometry.   Lung Age = 89 years Albuterol Neb  Lot# [id number]     Exp. 10/2017   A/P: Diabetes longstanding diagnosed currently uncontrolled. Patient reports 1 hypoglycemic events and is able to verbalize appropriate hypoglycemia  management plan. Patient reports adherence with medication. Continued metformin 1000 mg BID.  Increased glipizide IR to 5 mg BID. Victoza is nonpreferred with  medicaid.  Will start Tanzeum 30 mg weekly to be initiated at next visit.  Have instructed patient to bring Tanzeum to next visit. Instructed patient to continue checking blood glucose twice daily and to bring values and medications to next visit. Next A1C anticipated 08/2016.  Sent refills for metformin.   ASCVD risk greater than 7.5%. Continued Aspirin 81 mg and Continued atorvastatin 20 mg. Refilled atorvastatin.   Spirometry evaluation reveals moderate obstructive lung disease corresponding to GOLD Classification B based  0 exacerbations in the past year and mMRC score of 2. Post bronchodilator revealed no significant improvement in FVC or FEV1. Continued albuterol as needed for shortness of breath.  Started Spiriva Handihaler 18 mcg 1 inhalation daily.  Explained differences in rescue and long acting inhalers. Educated patient and daughters on purpose, proper use, potential adverse effects. Reviewed results of pulmonary function tests.  Pt verbalized understanding of results and education.  Refilled albuterol.   Chronic tobacco abuse of 3 cigs per day in patient who is uninterested in quitting at this time.   Encouraged cessation.  Reassess in future visits.   Written patient instructions provided.  Total time in face to face counseling 60 minutes.   Follow up in Pharmacist Clinic Visit in 3 weeks.   Patient seen with Alphonzo Severanceyan Ragan, PharmD Candidate,  Rosalita ChessmanKenny Kang, PharmD PGY1 Resident and Hazle NordmannKelsy Combs, PharmD PGY2 Resident

## 2016-07-15 NOTE — Assessment & Plan Note (Signed)
Diabetes longstanding diagnosed currently uncontrolled. Patient reports 1 hypoglycemic events and is able to verbalize appropriate hypoglycemia management plan. Patient reports adherence with medication. Continued metformin 1000 mg BID.  Increased glipizide IR to 5 mg BID. Victoza is nonpreferred with [place] medicaid.  Will start Tanzeum 30 mg weekly to be initiated at next visit.  Have instructed patient to bring Tanzeum to next visit. Instructed patient to continue checking blood glucose twice daily and to bring values and medications to next visit. Next A1C anticipated 08/2016.  Sent refills for metformin.   ASCVD risk greater than 7.5%. Continued Aspirin 81 mg and Continued atorvastatin 20 mg. Refilled atorvastatin.

## 2016-07-15 NOTE — Patient Instructions (Signed)
Start Spiriva 1 inhalation once daily  Continue Albuterol as needed  I have refilled metformin and atorvastatin.  Please start taking glipizide 5 mg twice daily  Bring Victoza in when you receive it from the pharmacy.    Followup with Dr Raymondo BandKoval in 3-4 weeks   Dainika ?kac??i sp?'ir?va 1 insul??ana suru garnuh?s  ?va?yaka k? l?g? Albuterol j?r? r?khnuh?s  mail? m??apharmina ra ??av?bhas?inina riphila gar?k? chu.  Kr?pay? pratidina du'? pa?aka glipi'i?a 5 mil?gr?ma linuh?s  tap?'?l? ph?rm?s?b??a pr?pta gard? vik??z? ly?'unuh?s.  ??. K?bhalasam?ga 3-4 hapt?m? pachy?'unuh?s

## 2016-07-16 NOTE — Progress Notes (Signed)
Patient ID: Kaylee Ramos, female   DOB: 12/10/1950, 65 y.o.   MRN: [id number] Reviewed: Agree with Dr. Macky LowerKoval's documentation and management.

## 2016-07-26 ENCOUNTER — Encounter: Payer: Self-pay | Admitting: Internal Medicine

## 2016-08-05 ENCOUNTER — Ambulatory Visit (INDEPENDENT_AMBULATORY_CARE_PROVIDER_SITE_OTHER): Payer: Medicaid Other | Admitting: Pharmacist

## 2016-08-05 ENCOUNTER — Encounter: Payer: Self-pay | Admitting: Pharmacist

## 2016-08-05 DIAGNOSIS — E1165 Type 2 diabetes mellitus with hyperglycemia: Secondary | ICD-10-CM | POA: Diagnosis present

## 2016-08-05 LAB — POCT GLYCOSYLATED HEMOGLOBIN (HGB A1C): HEMOGLOBIN A1C: 8.2

## 2016-08-05 NOTE — Progress Notes (Signed)
    S:    Patient arrives ambulating with some assistance from her two daughters. The patient speaks [place]epali and a translator was used during this encounter. Presents for diabetes and COPD evaluation, education, and management at the request of Dr. Ottie GlazierGunadasa. Patient was referred on 07/05/16 for COPD and DMII.  Patient was last seen by Primary Care Provider on 07/05/2016. Reports not using albuterol since starting Spiriva and denies shortness of breath. Reports and demonstrates proper technique using Spiriva HandiHaler.  Brings both Victoza and Tanzeum to clinic today which her daughters state is $3 copay.   Patient reports Diabetes was diagnosed in 2016.   Patient reports adherence with medications.  Current diabetes medications include: glipizide 5 mg BID with food, metformin 1000 mg BID Current hypertension medications include: none Current hyperlipidemia medications include: atorvastatin 20 mg daily, ASA 81  Patient denies hypoglycemic events. Daughters report knowledge of hypoglycemia treatment.  Patient reported dietary habits: Only eating 1 meal a day currently at either morning or evening. Patient does not like eat meat or fish either. Patient complains of low appetite.  Was eating more rice before but now states she gets nausea when she is eating something.  Bean soup or spinach soup previously as well but now gets nausea.  Patient reports nausea when taking both medications or eating food.   Patient reported exercise habits: Patient walks in morning and evening everyday for 10 minutes.     Patient reports nocturia 2-3 times per night.  Patient denies neuropathy. Patient denies visual changes. Reports last eye exam/eye surgery in December 2016 Patient reports self foot exams. Denies changes.   Home fasting CBG: 11/14-11/16 200, 229, 229 mg/dL 2 hour post-prandial/random CBG: 150, 69, 182 mg/dL.  Patient reports blood glucose meter is giving error message so has not checked since  07/22/16.   O:  Lab Results  Component Value Date   HGBA1C 10.9 05/05/2016   There were no vitals filed for this visit.  A/P: Diabetes longstanding currently uncontrolled. Patient denies hypoglycemic events and is able to verbalize appropriate hypoglycemia management plan. Patient reports adherence with medication. Control is suboptimal due to inconsistent diet. Some suboptimal control is also due to the fact that medications are still being adjusted.  Started Victoza (liraglutide) 0.6mg  daily. Patient was counseled on how to use the Victoza pen and daughters were able to demonstrate proper use. The first dose was given in the office today. Discontinued glipizide. Next A1C anticipated in 3 months. A1c today was 8.2%  ASCVD risk greater than 7.5%. Continued Aspirin 81 mg and Continued atorvastatin 20 mg.   COPD currently controlled on Spiriva and PRN albuterol. Patient denies SOB since the start of Spiriva and has not needed albuterol. No changes to COPD medications.   Written patient instructions provided.  Total time in face to face counseling 60 minutes.   Follow up in Pharmacist Clinic Visit in 2 weeks on 08/19/16.   Patient seen with Alphonzo Severanceyan Ragan, PharmD Candidate,  Rosalita ChessmanKenny Kang, PharmD PGY1 Resident and Hazle NordmannKelsy Combs, PharmD PGY2 Resident

## 2016-08-05 NOTE — Patient Instructions (Signed)
Stop Glipizide  Please start Victoza 0.6 mg once daily  Followup with Dr Raymondo BandKoval in 2 weeks    Glipizide ??????????  ????? ????? ?? ??? ??????? 0.6 ????????? ???? ?????????  ??? ??????? 2 ??????? ????????????  Glipizide r?knuh?s  kr?pay? dainika ?ka pa?aka bhik??ja 0.6 Mil?gr?ma sur? garnuh?s  ??. K?bhalasam?ga 2 hapt?m? pachy?'unuh?s

## 2016-08-05 NOTE — Assessment & Plan Note (Signed)
Diabetes longstanding currently uncontrolled. Patient denies hypoglycemic events and is able to verbalize appropriate hypoglycemia management plan. Patient reports adherence with medication. Control is suboptimal due to inconsistent diet. Some suboptimal control is also due to the fact that medications are still being adjusted.  Started Victoza (liraglutide) 0.6mg  daily. Patient was counseled on how to use the Victoza pen and daughters were able to demonstrate proper use. The first dose was given in the office today. Discontinued glipizide. Next A1C anticipated in 3 months. A1c today was 8.2%

## 2016-08-09 NOTE — Progress Notes (Signed)
Patient ID: Kaylee Ramos, female   DOB: 06/05/1951, 65 y.o.   MRN: [id number] Reviewed: Agree with Dr. Koval's documentation and management.  

## 2016-08-19 ENCOUNTER — Ambulatory Visit (INDEPENDENT_AMBULATORY_CARE_PROVIDER_SITE_OTHER): Payer: Medicaid Other | Admitting: Pharmacist

## 2016-08-19 ENCOUNTER — Encounter: Payer: Self-pay | Admitting: Pharmacist

## 2016-08-19 DIAGNOSIS — E1165 Type 2 diabetes mellitus with hyperglycemia: Secondary | ICD-10-CM

## 2016-08-19 NOTE — Assessment & Plan Note (Signed)
Diabetes longstanding currently with CBGs much improved since last visit. Patient denies hypoglycemic events and is able to verbalize appropriate hypoglycemia management plan. Patient reports adherence with medication. Control is suboptimal due to inconsistent diet. Encouraged patient to eat as much as she wants of whatever she likes as long as her blood sugars are within range and to try to eat small meals.  Continued Victoza (liraglutide) 0.6 mg daily.  Hesitant to increase dose at this time due to patients decreased appetite. Will continue to monitor weight and may consider holding Victoza with significant weight loss and/or complaint of anorexia. Continue metformin 1000 mg BID.

## 2016-08-19 NOTE — Progress Notes (Signed)
    S:    Patient arrives in good spirits with her daughters and grand-daughter in Social worker[place]..  The patient speaks [place] and a translator Social worker(Lata) was used during this encounter. Presents for diabetes and COPD evaluation, education, and management at the request of Dr. Ottie GlazierGunadasa. Patient was referred on 07/05/16 for COPD and DMII.  Patient was last seen by Primary Care Provider on 07/05/2016.  Denies shortness of breath with use of Spiriva daily and has not needed albuterol.    Patient reports adherence with medications.  Current diabetes medications include: Victoza 0.6 mg daily, metformin 1000 mg BID  Patient denies hypoglycemic events.  Patient reported dietary habits: Patient states she does not have much of an appetite.  Denies coughing or choking when she eats.  Family states the decreased appetite started before Victoza.  Patient trying to eat small meals.  Patients family states she does not have any depressive symptoms.    O:  Lab Results  Component Value Date   HGBA1C 8.2 08/05/2016   Home fasting CBG: 113, 189, 160, 133, 105, 125, 143, 115, 140, 127, 105, 108, 175 2 hour post-prandial/random CBG: 170, 103, 150, 153, 178, 107, 107, 115, 140, 122, 160, 155, 147, 101   A/P: Diabetes longstanding currently with CBGs much improved since last visit. Patient denies hypoglycemic events and is able to verbalize appropriate hypoglycemia management plan. Patient reports adherence with medication. Control is suboptimal due to inconsistent diet. Encouraged patient to eat as much as she wants of whatever she likes as long as her blood sugars are within range and to try to eat small meals.  Continued Victoza (liraglutide) 0.6 mg daily.  Hesitant to increase dose at this time due to patients decreased appetite. Will continue to monitor weight and may consider holding Victoza with significant weight loss and/or complaint of anorexia. Continue metformin 1000 mg BID.  Clarified she should no longer take  glipizide.   Next A1C anticipated in 3 months.  ASCVD risk greater than 7.5%. Continued Aspirin 81 mg and Continued atorvastatin 20 mg.   Written patient instructions provided.  Total time in face to face counseling 30 minutes.   Follow up in 6 weeks with Dr Ottie GlazierGunadasa.   Patient seen with Hazle NordmannKelsy Combs, PharmD PGY2 Resident

## 2016-08-19 NOTE — Progress Notes (Signed)
Patient ID: Kaylee Ramos, female   DOB: 04/08/1951, 65 y.o.   MRN: [id number] Reviewed: Agree with Dr. Koval's documentation and management.  

## 2016-08-19 NOTE — Patient Instructions (Signed)
Continue current medications  Followup with Dr Ottie GlazierGunadasa in 6 weeks

## 2016-09-27 ENCOUNTER — Encounter: Payer: Self-pay | Admitting: Internal Medicine

## 2016-09-27 ENCOUNTER — Ambulatory Visit (INDEPENDENT_AMBULATORY_CARE_PROVIDER_SITE_OTHER): Payer: Medicaid Other | Admitting: Internal Medicine

## 2016-09-27 DIAGNOSIS — E1165 Type 2 diabetes mellitus with hyperglycemia: Secondary | ICD-10-CM | POA: Diagnosis present

## 2016-09-27 MED ORDER — VICTOZA 18 MG/3ML ~~LOC~~ SOPN: 0.6000 mg | PEN_INJECTOR | Freq: Every day | SUBCUTANEOUS | 1 refills | Status: DC

## 2016-09-27 MED ORDER — ACCU-CHEK FASTCLIX LANCETS MISC: 3 refills | Status: AC

## 2016-09-27 NOTE — Progress Notes (Signed)
   [facility] Phone: [phone]   Date of Visit: 09/27/2016   HPI: Interpreter initially used [id number]. Then family member who speaks English wanted to interpret.   DM2: - taking Metformin as prescribed - ran out of Victoza on Friday - ran out of lancets so has not been able to check sugars regularly recently. Did check today before coming to clinic and reported it was 300 - family had questions regarding medications and CBG checks; we discussed this at length  ROS: See HPI.  PMFSH:  PMH:  DM2 Hx CVA Tobacco Use   PHYSICAL EXAM: BP (!) 99/58   Pulse 93   Temp 97.6 F (36.4 C) (Oral)   Wt 92 lb (41.7 kg)   SpO2 95%   BMI 19.23 kg/m  GEN: NAD CV: RRR, no murmurs, rubs, or gallops PULM: CTAB, normal effort ABD: Soft, nontender, nondistended, NABS, no organomegaly SKIN: No rash or cyanosis; warm and well-perfused EXTR: No lower extremity edema or calf tenderness PSYCH: Mood and affect euthymic, normal rate and volume of speech NEURO: Awake, alert   ASSESSMENT/PLAN:   Type 2 diabetes mellitus, uncontrolled Refilled Victoza and lancets. Discussed with family to call pharmacy to send refill requests to clinic when medication is running low. Check CBG AM fasting and 2 hour PP after largest meal. Follow up in 2 weeks.    Palma HolterKanishka G Airen Stiehl, MD PGY 2 [facility]

## 2016-09-27 NOTE — Assessment & Plan Note (Signed)
Refilled Victoza and lancets. Discussed with family to call pharmacy to send refill requests to clinic when medication is running low. Check CBG AM fasting and 2 hour PP after largest meal. Follow up in 2 weeks.

## 2016-09-27 NOTE — Patient Instructions (Signed)
Please follow up in 2 weeks for diabetes.   Please check sugar twice a day: in the morning before eating anything or drinking anything with sugar AND 2 hours after the biggest meal of the day. Please keep a record of this and bring it to the appointment.   I sent the medications to the pharmacy.

## 2016-10-03 ENCOUNTER — Other Ambulatory Visit: Payer: Self-pay | Admitting: Family Medicine

## 2016-10-03 DIAGNOSIS — E1165 Type 2 diabetes mellitus with hyperglycemia: Principal | ICD-10-CM

## 2016-10-03 DIAGNOSIS — IMO0001 Reserved for inherently not codable concepts without codable children: Secondary | ICD-10-CM

## 2016-10-12 ENCOUNTER — Ambulatory Visit: Payer: Medicaid Other | Admitting: Internal Medicine

## 2016-11-10 ENCOUNTER — Encounter: Payer: Self-pay | Admitting: Internal Medicine

## 2016-11-10 ENCOUNTER — Ambulatory Visit (INDEPENDENT_AMBULATORY_CARE_PROVIDER_SITE_OTHER): Payer: Medicaid Other | Admitting: Internal Medicine

## 2016-11-10 VITALS — BP 118/60 | HR 101 | Temp 98.2°F | Ht <= 58 in | Wt 88.6 lb

## 2016-11-10 DIAGNOSIS — E118 Type 2 diabetes mellitus with unspecified complications: Secondary | ICD-10-CM

## 2016-11-10 DIAGNOSIS — E1165 Type 2 diabetes mellitus with hyperglycemia: Secondary | ICD-10-CM

## 2016-11-10 LAB — POCT GLYCOSYLATED HEMOGLOBIN (HGB A1C): HEMOGLOBIN A1C: 7.1

## 2016-11-10 MED ORDER — ACCU-CHEK SOFTCLIX LANCET DEV KIT: PACK | 0 refills | Status: AC

## 2016-11-10 NOTE — Assessment & Plan Note (Signed)
A1c improved at 7.1. Continue current regimen. Would not increase Victoza as I do not want her to lose weight.  Due to her history of hypoglycemia in the past with her age, her A1c goal should be < 8.

## 2016-11-10 NOTE — Patient Instructions (Signed)
Your sugars are overall well controlled.  Please make a follow up appointment in 3 months for diabetes or sooner if you have concerns.

## 2016-11-10 NOTE — Progress Notes (Signed)
   [facility] Phone: [phone]   Date of Visit: 11/10/2016   HPI: Patient's grand-daughter opted to be interpreter   DM2:  - here for follow up for diabetes - upon review of CBGs: am fasting is in the low 100s consistently. 2 hr Post prandial vary between 130-180. No low cbs. Grand-daughter reports that patient reports of pain with checking cbgs. She does not like to have it checked twice a day.  - taking Metformin and Victoza as prescribed  - no polyuria, polydipsia.  - reports she feels well and has no concerns.    Health Maintenance:  - up to date on mammogram - discussed recommendations for colon cancer screen. Patient declined.  - discussed recommendations for screening for osteoporosis. Patient declined - discussed screening for pap smear. Patient has never had pap smear. She still has her uterus. She declines.    - discussed patient's weight today as well. This was brought up today because at the last visit, patient's daughter noted of her decreased appetite. Today, grand-daughter reports patient has always eating a small amount; there is not significant change in her diet per her. Patient thinks that her appetite changes with the season. Patient is not concerned about weight loss. Denies fevers or night sweats. No blood in stool or dark tarry stool. No hematuria. Reports of 2-3 cigarettes/day x 53 years. Declines any blood work.   ROS: See HPI.  PMFSH:  PMH:  COPD DM2 Hx CVA Tobacco Use   PHYSICAL EXAM: BP 118/60   Pulse (!) 101   Temp 98.2 F (36.8 C) (Oral)   Ht 4\' 10"  (1.473 m)   Wt 88 lb 9.6 oz (40.2 kg)   SpO2 96%   BMI 18.52 kg/m  GEN: NAD CV: RRR, no murmurs, rubs, or gallops PULM: CTAB, normal effort ABD: Soft, nontender, nondistended, NABS, no organomegaly SKIN: No rash or cyanosis; warm and well-perfused EXTR: No lower extremity edema or calf tenderness PSYCH: Mood and affect euthymic, normal rate and volume of  speech NEURO: Awake, alert, no focal deficits grossly, normal speech   ASSESSMENT/PLAN:  Health maintenance:  - as noted above, patient declined DEXA, colorectal cancer, cervical cancer screening  Type 2 diabetes mellitus (HCC) A1c improved at 7.1. Continue current regimen. Would not increase Victoza as I do not want her to lose weight.  Due to her history of hypoglycemia in the past with her age, her A1c goal should be < 8.  Follow up in 3 months or sooner if there are concerns.   Palma HolterKanishka G Geneva Pallas, MD PGY 2 [facility]

## 2016-12-02 ENCOUNTER — Other Ambulatory Visit: Payer: Self-pay | Admitting: Internal Medicine

## 2016-12-06 NOTE — Telephone Encounter (Signed)
2nd request.  Martin, Tamika L, RN  

## 2017-02-08 ENCOUNTER — Ambulatory Visit: Payer: Medicaid Other | Admitting: Internal Medicine

## 2017-02-15 ENCOUNTER — Other Ambulatory Visit: Payer: Self-pay | Admitting: Internal Medicine

## 2017-02-15 DIAGNOSIS — IMO0001 Reserved for inherently not codable concepts without codable children: Secondary | ICD-10-CM

## 2017-02-15 DIAGNOSIS — E1165 Type 2 diabetes mellitus with hyperglycemia: Principal | ICD-10-CM

## 2017-03-01 ENCOUNTER — Encounter: Payer: Self-pay | Admitting: Internal Medicine

## 2017-03-01 ENCOUNTER — Ambulatory Visit (INDEPENDENT_AMBULATORY_CARE_PROVIDER_SITE_OTHER): Payer: Medicaid Other | Admitting: Internal Medicine

## 2017-03-01 VITALS — BP 100/60 | HR 85 | Temp 98.4°F | Ht <= 58 in | Wt 89.4 lb

## 2017-03-01 DIAGNOSIS — E118 Type 2 diabetes mellitus with unspecified complications: Secondary | ICD-10-CM | POA: Diagnosis present

## 2017-03-01 LAB — POCT GLYCOSYLATED HEMOGLOBIN (HGB A1C): HEMOGLOBIN A1C: 8.4

## 2017-03-01 NOTE — Progress Notes (Signed)
   [facility] Phone: [phone]   Date of Visit: 03/01/2017   HPI:  Family member opted to translate for this visit. This family member lives with patient and cares for her.   DM2: - a1c: 7.1 (11/2016) < 8.2 (07/2016) - Medications: Metformin 1000mg  BID, Victoza 0.6mg  - denies polyuria or polydipsia - family brought cbg log. They check her cbgs once to twice a day because it hurts her fingers (AM fasting and 2hr PP dinner). cbgs have mainly been in the 100s and rarely 200s  Weight Loss:  - family reports patients have decreased appetite and this may have worsening since she was started on Victoza  - patient only eats two meals a day. She reports she does not have much of an appetite. She does have poor dentition of her mandibular teeth and is edentulous of her maxillary teeth which may be somewhat contributing. Family reports she has an appointment with the dentist next month. She denies depressed mood or anhedonia.  She denies any abdominal pain. She has normal bowel movements.  - 89.4lb today from 88.6lb three months ago, and 96lb in November  - as mentioned before patient is not interesting in health maintenance screening for breast cancer, colon cancer, cervical cancer.    ROS: See HPI.  PMFSH:  DM2 COPD Poor Dentition CVA  PHYSICAL EXAM: BP 100/60 (BP Location: Left Arm, Patient Position: Sitting, Cuff Size: Normal)   Pulse 85   Temp 98.4 F (36.9 C) (Oral)   Ht 4\' 10"  (1.473 m)   Wt 89 lb 6.4 oz (40.6 kg)   SpO2 95%   BMI 18.68 kg/m  Gen: NAD HEENT: Edentulous of maxillary teeth, poor dentition of mandibular teeth CV: RRR, systolic murmur PULM: CTAB, normal effort ABD: Soft, nontender, nondistended, NABS, no organomegaly SKIN: No rash or cyanosis; warm and well-perfused EXTR: No lower extremity edema or calf tenderness PSYCH: Mood and affect euthymic NEURO: Awake, alert,   ASSESSMENT/PLAN:  Type 2 diabetes mellitus (HCC) A1c is 8.4  today (from 7.1). She has not tolerated sulfonylureas in the past. Not a good candidate for SGLT2 inhibitors as she has low/normal BP and is at increased risk of dehydration. Will not increase dose of Victoza because I do not want to worsen her decreased appetite. Discussed with preceptor. [facility].     Palma HolterKanishka G Keiasia Christianson, MD PGY 2 [facility]

## 2017-03-01 NOTE — Patient Instructions (Signed)
[facility]POINT[facility] ON July 19TH AT 3:30pm

## 2017-03-03 NOTE — Assessment & Plan Note (Signed)
A1c is 8.4 today (from 7.1). She has not tolerated sulfonylureas in the past. Not a good candidate for SGLT2 inhibitors as she has low/normal BP and is at increased risk of dehydration. Will not increase dose of Victoza because I do not want to worsen her decreased appetite. Discussed with preceptor. [facility].

## 2017-03-24 ENCOUNTER — Ambulatory Visit: Payer: Medicaid Other

## 2017-03-24 ENCOUNTER — Encounter: Payer: Self-pay | Admitting: Family Medicine

## 2017-03-24 DIAGNOSIS — I700 Atherosclerosis of aorta: Secondary | ICD-10-CM | POA: Insufficient documentation

## 2017-03-24 DIAGNOSIS — I639 Cerebral infarction, unspecified: Secondary | ICD-10-CM | POA: Insufficient documentation

## 2017-03-24 HISTORY — DX: Atherosclerosis of aorta: I70.0

## 2017-04-28 ENCOUNTER — Ambulatory Visit (INDEPENDENT_AMBULATORY_CARE_PROVIDER_SITE_OTHER): Payer: Medicaid Other | Admitting: Family Medicine

## 2017-04-28 DIAGNOSIS — H547 Unspecified visual loss: Secondary | ICD-10-CM | POA: Diagnosis not present

## 2017-04-28 DIAGNOSIS — Z550 Illiteracy and low-level literacy: Secondary | ICD-10-CM | POA: Diagnosis not present

## 2017-04-28 DIAGNOSIS — K000 Anodontia: Secondary | ICD-10-CM

## 2017-04-28 DIAGNOSIS — F0390 Unspecified dementia without behavioral disturbance: Secondary | ICD-10-CM

## 2017-04-28 DIAGNOSIS — K08109 Complete loss of teeth, unspecified cause, unspecified class: Secondary | ICD-10-CM

## 2017-04-28 DIAGNOSIS — Z559 Problems related to education and literacy, unspecified: Secondary | ICD-10-CM | POA: Diagnosis not present

## 2017-04-28 NOTE — Progress Notes (Signed)
[facility]:   Patient is accompanied by: Suan Halter (66 years old) Primary caregiver: grandson, grandson's wife. Patient's lives with their family. Patient information was obtained from patient, relative(s) and past medical records. Video interpreter in Nepali: Andria Frames [phone] History/Exam limitations: dementia and grandson's age (young adult female). Primary Care Provider: Smiley Houseman, MD Referring provider: Dennie Fetters, MD Reason for referral:  Chief Complaint  Patient presents with  . Memory Loss    History Chief Complaint  Patient presents with  . Memory Loss    HPI by problems:   Cognitive impairment concern What problems with thinking are there?  memory loss  When were the changes first noticed?  several year ago - 5 to 6 years  Did this change occur abruptly or gradually?  abrupt  How have the changes progressed since then?  worsen  Does their level of alertness change throughout the day?  yes, sometimes perhaps from sugar problem  Is their speech disorganized, rambling?  yes, alot  Has there been any tremors or abnormal movements?  no  Have they had in hallucinations or delusions:  No Trouble seeing after eye surgery  Have they appeared more anxious or sad lately?  Yes- difficult to be more specific.   Do they still have interests or activities they enjor doing?  no  How has their appetite been lately?  No appetite No teeth  How has their sleep been lately?  Variable quality  Problem behaviors: No suspiciousness or paranoia   Compared to 5 to 10 years ago, how is the patient at:  Problems with Judgment, e.g., problem making decisions, bad financial decisions, problems with thinking?  She does not know people around here. She can name her children but does not know names of others   Less interested in hobbies or previously enjoyed activities? are worsening   Trouble remembering  appointments? are worsening    Remembering things about family and friends e.g. names,  occupations, birthdays, addresses?  show no change  Remembering things that have happened recently?  are worsening She does not remember what she did yesterday Does not remember events, like going to the hospital.   Recalling conversations a few days later?  are worsening  Remembering what day and month it is? show no change - has been impaired for long time.   Remembering where things are usually kept?  are worsening  Losing things?  are worsening     Outpatient Encounter Prescriptions as of 04/28/2017  Medication Sig  . ACCU-CHEK FASTCLIX LANCETS MISC Use to check sugars twice a day  . albuterol (PROVENTIL HFA;VENTOLIN HFA) 108 (90 Base) MCG/ACT inhaler Inhale 2 puffs into the lungs every 4 (four) hours as needed for wheezing or shortness of breath.  Marland Kitchen aspirin EC 81 MG tablet Take 1 tablet (81 mg total) by mouth daily.  Marland Kitchen atorvastatin (LIPITOR) 20 MG tablet TAKE 1 TABLET BY MOUTH DAILY  . B-D ULTRAFINE III SHORT PEN 31G X 8 MM MISC USE WITH VICTOZA AS DIRECTED  . Blood Glucose Monitoring Suppl (ACCU-CHEK NANO SMARTVIEW) W/DEVICE KIT Use as directed  . glucose blood (ACCU-CHEK SMARTVIEW) test strip Check sugar once daily  . Lancets Misc. (ACCU-CHEK SOFTCLIX LANCET DEV) KIT Use once a day to check sugar  . metFORMIN (GLUCOPHAGE) 1000 MG tablet Take 1 tablet (1,000 mg total) by mouth 2 (two) times daily with a meal.  . tiotropium (SPIRIVA HANDIHALER) 18 MCG inhalation capsule Place 1 capsule (18 mcg total) into  inhaler and inhale daily.  Marland Kitchen VICTOZA 18 MG/3ML SOPN Inject 0.1 mLs (0.6 mg total) into the skin daily.     History Patient Active Problem List   Diagnosis Date Noted  . Thoracic aorta atherosclerosis ([place]) 03/24/2017    Priority: High  . CVA (cerebral vascular accident) [facility])     Priority: High  . COPD, group B, by GOLD 2017 classification ([place]) 07/15/2016    Priority: High   . Partial paralysis of right hand ([place]) 04/02/2015    Priority: High  . Type 2 diabetes mellitus ([place]) 03/04/2015    Priority: High  . Edentulous 04/28/2017  . Healthcare maintenance 01/16/2016  . Refugee health examination 03/04/2015   Past Medical History:  Diagnosis Date  . Acute right eye pain 04/12/2016  . CVA (cerebral infarction) 04/02/2015  . Diabetes mellitus without complication ([place])   . Eyelid cyst 04/02/2015  . Hypertension   . Poor dentition 03/04/2015  . Stroke ([place])   . Thoracic aorta atherosclerosis ([place]) 03/24/2017   Past Surgical History:  Procedure Laterality Date  . MASS EXCISION Right 08/11/2015   Procedure: EXCISION MASS RIGHT UPPER LID AND PLASTIC CLOSURE;  Surgeon: Cristine Polio, MD;  Location: [place];  Service: Plastics;  Laterality: Right;  right upper eyelid   Family History  Problem Relation Age of Onset  . Seizures Daughter    Social History   Social History  . Marital status: Single    Spouse name: N/A  . Number of children: N/A  . Years of education: N/A   Social History Main Topics  . Smoking status: Current Every Day Smoker    Packs/day: 0.15    Years: 51.00    Types: Cigarettes  . Smokeless tobacco: Never Used  . Alcohol use No  . Drug use: No  . Sexual activity: No   Other Topics Concern  . Not on file   Social History Narrative  . No narrative on file    Educational History: Zero years formal education  Basic Activities of Daily Living  AADLs IIndependent NNeeds Assistance DDependent  Bbathing  x xx  Ddressing  xx   AAmbulation x xx   TToileting  xx   EEating xx         Instrumental Activities of Daily Living IADL Independent Needs Assistance Dependent  Cooking   X vision prob  Housework   X vision prob  Manage Medications   x  Manage the telephone   x  Shopping for food, clothes, Meds, etc   x  Use transportation   x  Manage Finances   x    Caregivers in home: grandson, grandson's  wife, grandson's aunt (disabled),  2-3 hours per [place] per day but has been reduced to two which grandson reports as inadequate.  He had difficulty.      FALLS in last five office visits:  Fall Risk  04/28/2017 03/01/2017 09/27/2016 07/05/2016 06/21/2016  Falls in the past year? _0   Comment - - - - -    Health Maintenance reviewed: Immunization History  Administered Date(s) Administered  . Influenza,inj,Quad PF,6+ Mos 06/06/2015, 06/15/2016  . Pneumococcal Conjugate-13 01/16/2016  . Tdap 06/15/2016   Health Maintenance Topics with due status: Overdue     Topic Date Due   COLONOSCOPY 09/06/1998   DEXA SCAN 09/06/2013   OPHTHALMOLOGY EXAM 08/06/2016   FOOT EXAM 01/15/2017   URINE MICROALBUMIN 01/15/2017   PNA vac Low Risk Adult 01/15/2017  Health Maintenance Topics with due status: Due On     Topic Date Due   INFLUENZA VACCINE 04/06/2017    Diet: Regular, soft food bc edentulous.  Nutritional supplements: none   ROS Unable to obtain bc of cognitive impairment Vital Signs Weight: 90 lb 9.6 oz (41.1 kg) Body mass index is 18.94 kg/m. CrCl cannot be calculated (Patient's most recent lab result is older than the maximum 21 days allowed.). Body surface area is 1.3 meters squared. Vitals:   04/28/17 1455  BP: 122/64  Pulse: 86  Temp: 98.1 F (36.7 C)  TempSrc: Oral  SpO2: 95%  Weight: 90 lb 9.6 oz (41.1 kg)  Height: _0  (1.473 m)   Wt Readings from Last 3 Encounters:  04/28/17 90 lb 9.6 oz (41.1 kg)  03/01/17 89 lb 6.4 oz (40.6 kg)  11/10/16 88 lb 9.6 oz (40.2 kg)    Hearing Screening   Method: Audiometry   _1  _2  _3  _4  _5  _6  _7  _8  _9   Right ear:   40 40 40  40    Left ear:   40 40 40  40    Vision Screening Comments: Patient stated that she was unable to see the "E" chart  Physical Examination:  VS reviewed GEN: Alert, Cooperative, Groomed, NAD  EXT: No peripheral leg edema.  Neuro: moving  all extremititesTremor not present Gait: Walking with contact guarding by grandson.  Gait: No significant path deviation, No shuffling Psych: Normal affect/thought/speech/language   Mini-Mental State Examination or Montreal Cognitive Assessment:  Patient did  require additional cues or prompts to complete tasks. Patient was cooperative and attentive to testing tasks Patient uncertain motivation to perform well  No flowsheet data found.      Montreal Cognitive Assessment  04/29/2017  Attention: Read list of digits (0/2) 0  Delayed Recall (0/5) 2    Geriatric Depression Scale:  5 / 15  Labs  No results found for: VITAMINB12  No results found for: FOLATE  No results found for: TSH  No results found for: RPR    Chemistry      Component Value Date/Time   NA 137 05/05/2016 1552   K 4.4 05/05/2016 1552   CL 102 05/05/2016 1552   CO2 27 05/05/2016 1552   BUN 9 05/05/2016 1552   CREATININE 0.56 05/05/2016 1552      Component Value Date/Time   CALCIUM 9.7 05/05/2016 1552   ALKPHOS 122 05/05/2016 1552   AST 25 05/05/2016 1552   ALT 26 05/05/2016 1552   BILITOT 0.3 05/05/2016 1552       Lab Results  Component Value Date   HGBA1C 8.4 03/01/2017      Lab Results  Component Value Date   HGB 15.6 (H) 01/23/2015   HCT 46.0 01/23/2015    Imaging No head/brain imaging in EMR   Assessment and Plan: See problem list  Personal Strengths Supportive family/friends  Support System Strengths Family  Advanced Directives: Code Status: not discussed   29mnutes face to face where spent in total with counseling / coordination of care took more than 50% of the total time. Counseling involved discussion differential diagnosis, testing, prognosis, adherence, risk reduction, benefits of treatment, instructions, compliance.  Care was coordinated with our DCasimer Lanius LCSW, MSW who was contacted after the  patient during his visit to see if the patient could receive more  hours of personal care services.

## 2017-04-29 ENCOUNTER — Encounter: Payer: Self-pay | Admitting: Family Medicine

## 2017-04-29 DIAGNOSIS — Z550 Illiteracy and low-level literacy: Secondary | ICD-10-CM | POA: Insufficient documentation

## 2017-04-29 DIAGNOSIS — F0390 Unspecified dementia without behavioral disturbance: Secondary | ICD-10-CM | POA: Insufficient documentation

## 2017-04-29 DIAGNOSIS — H547 Unspecified visual loss: Secondary | ICD-10-CM | POA: Insufficient documentation

## 2017-04-29 DIAGNOSIS — F17200 Nicotine dependence, unspecified, uncomplicated: Secondary | ICD-10-CM | POA: Insufficient documentation

## 2017-04-29 DIAGNOSIS — Z559 Problems related to education and literacy, unspecified: Secondary | ICD-10-CM | POA: Insufficient documentation

## 2017-04-29 NOTE — Progress Notes (Signed)
[facility]:   Patient is accompanied by: Suan Halter (66 years old) Primary caregiver: grandson and grandson's wife Patient's lives with their family. Patient information was obtained from patient and grandson. History/Exam limitations: confusion and communication barrier Language. Primary Care Provider: Smiley Houseman, MD Referring provider: Dennie Fetters, MD Reason for referral:  Chief Complaint  Patient presents with  . geri assessment    History Chief Complaint  Patient presents with  . geri assessment    HPI by problems:   Cognitive impairment concern What problems with thinking are there?  memory loss  When were the changes first noticed?  several year ago - 5 to 6 years  Did this change occur abruptly or gradually?  abrupt  How have the changes progressed since then?  worsen  Does their level of alertness change throughout the day?  yes, sometimes perhaps from sugar problem  Is their speech disorganized, rambling?  yes, alot  Has there been any tremors or abnormal movements?  no  Have they had in hallucinations or delusions:  No Trouble seeing after eye surgery  Have they appeared more anxious or sad lately?  Yes- difficult to be more specific.   Do they still have interests or activities they enjor doing?  no  How has their appetite been lately?  No appetite No teeth  How has their sleep been lately?  Variable quality  Problem behaviors: No suspiciousness or paranoia   Compared to 5 to 10 years ago, how is the patient at:  Problems with Judgment, e.g., problem making decisions, bad financial decisions, problems with thinking?  She does not know people around here. She can name her children but does not know names of others (? Has not meet others?)   Less interested in hobbies or previously enjoyed activities? show no change   Trouble remembering appointments? are worsening    Remembering  things about family and friends e.g. names,  occupations, birthdays, addresses?  show no change  Remembering things that have happened recently?  are worsening She does not remember what she did yesterday Does not remember events, like going to the hospital.   Recalling conversations a few days later?  are worsening  Remembering what day and month it is? show no change  Remembering where things are usually kept?  are worsening  Losing things?  are worsening     Outpatient Encounter Prescriptions as of 04/28/2017  Medication Sig  . ACCU-CHEK FASTCLIX LANCETS MISC Use to check sugars twice a day  . albuterol (PROVENTIL HFA;VENTOLIN HFA) 108 (90 Base) MCG/ACT inhaler Inhale 2 puffs into the lungs every 4 (four) hours as needed for wheezing or shortness of breath.  Marland Kitchen aspirin EC 81 MG tablet Take 1 tablet (81 mg total) by mouth daily.  Marland Kitchen atorvastatin (LIPITOR) 20 MG tablet TAKE 1 TABLET BY MOUTH DAILY  . B-D ULTRAFINE III SHORT PEN 31G X 8 MM MISC USE WITH VICTOZA AS DIRECTED  . Blood Glucose Monitoring Suppl (ACCU-CHEK NANO SMARTVIEW) W/DEVICE KIT Use as directed  . glucose blood (ACCU-CHEK SMARTVIEW) test strip Check sugar once daily  . Lancets Misc. (ACCU-CHEK SOFTCLIX LANCET DEV) KIT Use once a day to check sugar  . metFORMIN (GLUCOPHAGE) 1000 MG tablet Take 1 tablet (1,000 mg total) by mouth 2 (two) times daily with a meal.  . tiotropium (SPIRIVA HANDIHALER) 18 MCG inhalation capsule Place 1 capsule (18 mcg total) into inhaler and inhale daily.  Marland Kitchen VICTOZA 18 MG/3ML SOPN Inject 0.1  mLs (0.6 mg total) into the skin daily.   No facility-administered encounter medications on file as of 04/28/2017.     History Patient Active Problem List   Diagnosis Date Noted  . Edentulous 04/28/2017  . Thoracic aorta atherosclerosis ([place]) 03/24/2017  . CVA (cerebral vascular accident) ([place])   . COPD, group B, by GOLD 2017 classification ([place]) 07/15/2016  . Healthcare maintenance 01/16/2016   . Partial paralysis of right hand ([place]) 04/02/2015  . Type 2 diabetes mellitus ([place]) 03/04/2015  . Refugee health examination 03/04/2015   Past Medical History:  Diagnosis Date  . Acute right eye pain 04/12/2016  . CVA (cerebral infarction) 04/02/2015  . Diabetes mellitus without complication ([place])   . Eyelid cyst 04/02/2015  . Hypertension   . Poor dentition 03/04/2015  . Stroke ([place])   . Thoracic aorta atherosclerosis ([place]) 03/24/2017   Past Surgical History:  Procedure Laterality Date  . MASS EXCISION Right 08/11/2015   Procedure: EXCISION MASS RIGHT UPPER LID AND PLASTIC CLOSURE;  Surgeon: Cristine Polio, MD;  Location: [place];  Service: Plastics;  Laterality: Right;  right upper eyelid   Family History  Problem Relation Age of Onset  . Seizures Daughter    Social History   Social History  . Marital status: Single    Spouse name: N/A  . Number of children: N/A  . Years of education: N/A   Social History Main Topics  . Smoking status: Current Every Day Smoker    Packs/day: 0.15    Years: 51.00    Types: Cigarettes  . Smokeless tobacco: Never Used  . Alcohol use No  . Drug use: No  . Sexual activity: No   Other Topics Concern  . Not on file   Social History Narrative  . No narrative on file    Educational History: Zero years formal education  Basic Activities of Daily Living  AADLs IIndependent NNeeds Assistance DDependent  Bbathing  x xx  Ddressing  xx   AAmbulation x xx   TToileting  xx   EEating xx         Instrumental Activities of Daily Living IADL Independent Needs Assistance Dependent  Cooking   X vision prob  Housework   X vision prob  Manage Medications   x  Manage the telephone   x  Shopping for food, clothes, Meds, etc   x  Use transportation   x  Manage Finances   x    Caregivers in home: grandson, grandson's wife, grandson's aunt (disabled),  2-3 hours per day but has been reduced.   Caregiver Burdens:  Yolanda Bonine states that the family is having a hard time caring for patient at home. They are getting 1-2 hours of personal care assistance per week at home, but they need more hours. Grandson needs to go back to work, but doesn't feel like he can leave his grandmother at home alone.   FALLS in last five office visits:  Fall Risk  04/28/2017 03/01/2017 09/27/2016 07/05/2016 06/21/2016  Falls in the past year? No No No No No  Comment - - - - -    Health Maintenance reviewed: Immunization History  Administered Date(s) Administered  . Influenza,inj,Quad PF,6+ Mos 06/06/2015, 06/15/2016  . Pneumococcal Conjugate-13 01/16/2016  . Tdap 06/15/2016   Health Maintenance Topics with due status: Overdue     Topic Date Due   COLONOSCOPY 09/06/1998   DEXA SCAN 09/06/2013   OPHTHALMOLOGY EXAM 08/06/2016   FOOT EXAM 01/15/2017  URINE MICROALBUMIN 01/15/2017   PNA vac Low Risk Adult 01/15/2017   Health Maintenance Topics with due status: Due On     Topic Date Due   INFLUENZA VACCINE 04/06/2017    Diet: Regular, soft food.  Nutritional supplements: none  Geriatric Syndromes:  Incontinence occasional  Dizziness no   Visual Impairment yes   Hearing impairment no  Eating impairment no  Impaired Memory or Cognition yes   Behavioral problems no   Weight loss yes, mild   ROS Denies fevers/chills; denies changes in appetite; denies changes in weight;  Denies changes in vision / hearing / smell / taste; Denies runny nose / ear pain or discharge / sore throat / sinus congestion / cough/w phlegm; Denies chest congestion / wheezing;  Denies chest pain; denies heart beating slower/thumps inside chest; denies racing heart/flutter; Denies dysuria; denies hematuria;  Denies constipation; denies melena/hematochezia; denies diarrhea;  Denies abdominal discomfort/gaseous bloating; denies N/V; denies heart burn;  Denies recent falls/unsteady gait;  Denies unilateral weakness / clumsiness / tingling /  numbness; denies tremors;  Denies sadness / anxiety / suicidal tendencies  Vital Signs Weight: 90 lb 9.6 oz (41.1 kg) Body mass index is 18.94 kg/m. CrCl cannot be calculated (Patient's most recent lab result is older than the maximum 21 days allowed.). Body surface area is 1.3 meters squared. Vitals:   04/28/17 1455  BP: 122/64  Pulse: 86  Temp: 98.1 F (36.7 C)  TempSrc: Oral  SpO2: 95%  Weight: 90 lb 9.6 oz (41.1 kg)  Height: 4' 10"  (1.473 m)   Wt Readings from Last 3 Encounters:  04/28/17 90 lb 9.6 oz (41.1 kg)  03/01/17 89 lb 6.4 oz (40.6 kg)  11/10/16 88 lb 9.6 oz (40.2 kg)    Hearing Screening   Method: Audiometry   125Hz  250Hz  500Hz  1000Hz  2000Hz  3000Hz  4000Hz  6000Hz  8000Hz   Right ear:   40 40 40  40    Left ear:   40 40 40  40    Vision Screening Comments: Patient stated that she was unable to see the "E" chart  Physical Examination:  VS reviewed GEN: Alert, Cooperative, Groomed, NAD HEENT: EOMI LUNGS: No Acc mm use, speaking in full sentences EXT: No peripheral leg edema.  SKIN: No lesion nor rashes on exposed skin Neuro: Oriented to person Gait: No significant path deviation, Step-through present  Psych: Normal affect/thought/speech/language   Mini-Mental State Examination or Montreal Cognitive Assessment:  Patient did  require additional cues or prompts to complete tasks. Patient was cooperative and attentive to testing tasks Patient did  appear motivated to perform well  MoCA-Blind completed. Unable to complete many of the parts, as patient has 0 years of formal education. Total score was 4/18, which adjusts to 13/30.  Geriatric Depression Scale:  5 / 15  Labs  No results found for: VITAMINB12  No results found for: FOLATE  No results found for: TSH  No results found for: RPR    Chemistry      Component Value Date/Time   NA 137 05/05/2016 1552   K 4.4 05/05/2016 1552   CL 102 05/05/2016 1552   CO2 27 05/05/2016 1552   BUN 9 05/05/2016  1552   CREATININE 0.56 05/05/2016 1552      Component Value Date/Time   CALCIUM 9.7 05/05/2016 1552   ALKPHOS 122 05/05/2016 1552   AST 25 05/05/2016 1552   ALT 26 05/05/2016 1552   BILITOT 0.3 05/05/2016 1552       Lab  Results  Component Value Date   HGBA1C 8.4 03/01/2017      Lab Results  Component Value Date   HGB 15.6 (H) 01/23/2015   HCT 46.0 01/23/2015    No results found for this or any previous visit (from the past 24 hour(s)).  Imaging none   Assessment and Plan: Problem List Items Addressed This Visit      Other   Edentulous      Personal Strengths Supportive family/friends  Support System Strengths Supportive Relationships and Family  Advanced Directives: Code Status: did not discuss  Patient to Follow up with Dr.Gunadasa in 1 month  Will talk with Casimer Lanius (CSW) to see if she could assist with getting patient more hours of personal care services.  Hyman Bible, MD PGY-3

## 2017-04-29 NOTE — Assessment & Plan Note (Addendum)
Working diagnosis of Dementia based primarily on a primary caretaker report of progressive cognitive decline, particularly with memory.  She has dependencies in her iADLs that may be from cognitive decline and / or visual impairment and / or unfamiliarity with culture.  She had difficulty understanding the MoCA-Blind task instructions which could be from neurocognitive decline and/or lack of any formal education. If dementia is present, her FAST Stage would be 4 to 5 with her impairment in ability to perform any iADL needed to live independently.   She does not appear to have behavioral issues per the grandson.   Were unable to spend much time to discuss the nature and natural history of dementia  Grandson requesting more hours of personal care services for his grandmother though it was difficult to clarify with him what her unmet needs might be.  Will ask Ms Bonnielee Haff, MSW, to contact family to explore this request appropriateness.  Recommended caregivers address advanced planning for patient's financial estate, future legal questions of competency and desired medical care the patient desires currently for resuscitation and for future care, including health care power of attorney agent, should patient become severely incapacitated.

## 2017-06-10 ENCOUNTER — Other Ambulatory Visit: Payer: Self-pay | Admitting: Internal Medicine

## 2017-06-13 NOTE — Telephone Encounter (Signed)
Have patient make a follow up appointment

## 2017-06-14 NOTE — Progress Notes (Signed)
   [facility] Phone: [phone]   Date of Visit: 06/15/2017   HPI: Patient is here with her grandson. They declined video interpreter. Grandson would like to interpret. Forms signed.   DM2:  - Medications: Victoza 0.6 daily, Metformin  BID - did not bring cbg record but son reports that her cbgs have been around  200, 150, 180, 190, 170. Lowest CBG is 150.  - ran out of victoza 1 week ago  - reports that her appetite has improved   ROS: See HPI.  PMFSH:  PMH: DM2 COPD Hx CVA Thoracic Aorta Atherosclerosis Dementia Visual Impairment Edentulous Tobacco Dependence Partial Paralysis of right hand Illiterate in Native Language   PHYSICAL EXAM: BP 110/62   Pulse 82   Temp 98.5 F (36.9 C) (Oral)   Wt 90 lb (40.8 kg)   SpO2 93%   BMI 18.81 kg/m  Gen: NAD Heart: RRR, no m/r/g Lungs: normal effort, CTAB Ext: no lower extremity edema Diabetic Foot Exam - Simple   Simple Foot Form Visual Inspection No deformities, no ulcerations, no other skin breakdown bilaterally:  Yes Sensation Testing Intact to touch and monofilament testing bilaterally:  Yes Pulse Check Posterior Tibialis and Dorsalis pulse intact bilaterally:  Yes Comments     ASSESSMENT/PLAN:  Health maintenance:  - declined Flu vaccine today   Type 2 diabetes mellitus (HCC) A1c 9.9 from 8.4. Has had hypoglycemia with sulfonylurea. Not the best candidate for SGLT2 because her blood pressure is low normal and she is at higher risk of becoming dehydrated/hypotensive due to her frailty. Unclear if she would be compliant with insulin as she does not like getting stuck now for cbgs and Victoza. Also, I think she is at a higher risk for hypoglycemia with insulin. In the past, hesitant to increase Victoza dose due to decreased appetite and low weight. Discussed with Dr. Raymondo Band. Will increase Victoza. Instructions given to increase by 1 click from 0.6mg  every 3 days if cbgs > 130. Follow  up in 2 weeks or sooner if there are concerns.    Palma Holter, MD PGY 3 [facility]

## 2017-06-14 NOTE — Telephone Encounter (Signed)
Pts granddaughter in law contacted and visit scheduled for 10/10 with pcp.

## 2017-06-15 ENCOUNTER — Ambulatory Visit (INDEPENDENT_AMBULATORY_CARE_PROVIDER_SITE_OTHER): Payer: Medicaid Other | Admitting: Internal Medicine

## 2017-06-15 ENCOUNTER — Encounter: Payer: Self-pay | Admitting: Internal Medicine

## 2017-06-15 VITALS — BP 110/62 | HR 82 | Temp 98.5°F | Wt 90.0 lb

## 2017-06-15 DIAGNOSIS — E118 Type 2 diabetes mellitus with unspecified complications: Secondary | ICD-10-CM

## 2017-06-15 LAB — POCT GLYCOSYLATED HEMOGLOBIN (HGB A1C): Hemoglobin A1C: 9.9

## 2017-06-15 MED ORDER — VICTOZA 18 MG/3ML ~~LOC~~ SOPN: 0.6000 mg | PEN_INJECTOR | Freq: Every day | SUBCUTANEOUS | 0 refills | Status: DC

## 2017-06-15 NOTE — Patient Instructions (Addendum)
We are going to increase the dose of Victoza to improve her sugars.  Start with 0.6mg  plus 1 click. Then you can go up 1 more click every 3 days if her sugar is greater than 130.  Follow up in 2 weeks for sugar.

## 2017-06-16 NOTE — Assessment & Plan Note (Signed)
A1c 9.9 from 8.4. Has had hypoglycemia with sulfonylurea. Not the best candidate for SGLT2 because her blood pressure is low normal and she is at higher risk of becoming dehydrated/hypotensive due to her frailty. Unclear if she would be compliant with insulin as she does not like getting stuck now for cbgs and Victoza. Also, I think she is at a higher risk for hypoglycemia with insulin. In the past, hesitant to increase Victoza dose due to decreased appetite and low weight. Discussed with Dr. Raymondo Band. Will increase Victoza. Instructions given to increase by 1 click from 0.6mg  every 3 days if cbgs > 130. Follow up in 2 weeks or sooner if there are concerns.

## 2017-08-01 ENCOUNTER — Other Ambulatory Visit: Payer: Self-pay | Admitting: Internal Medicine

## 2017-08-01 DIAGNOSIS — E1165 Type 2 diabetes mellitus with hyperglycemia: Principal | ICD-10-CM

## 2017-08-01 DIAGNOSIS — IMO0001 Reserved for inherently not codable concepts without codable children: Secondary | ICD-10-CM

## 2017-10-06 ENCOUNTER — Other Ambulatory Visit: Payer: Self-pay | Admitting: Family Medicine

## 2017-10-06 DIAGNOSIS — IMO0001 Reserved for inherently not codable concepts without codable children: Secondary | ICD-10-CM

## 2017-10-06 DIAGNOSIS — E1165 Type 2 diabetes mellitus with hyperglycemia: Principal | ICD-10-CM

## 2017-10-17 NOTE — Progress Notes (Signed)
   [facility] Phone: [phone]   Date of Visit: 10/18/2017   HPI:  Patient opted grandson to interpret  DM2: - current medications: Metformin 1000mg  BID and Victoza - grandson reports AM cbgs are between 107-120 - he checks one other time usually after lunch but does not wait until two hours post prandial. These number range from 100 to 250 - she denies polyuria or polydipsia   - in the past she has had issues with hypoglycemia with sulfonylureas. She is not the best candidate for SGLT; I think she is at risk for volume depletion with subsequent hypotension as her blood pressures are already low normal. We have been unable to increase the dose of her Victoza due to patient already dealing with decreased appetite and difficulty maintaining weight.    ROS: See HPI.  PMFSH:  PMH: DM2 COPD Hx CVA Thoracic Aorta Atherosclerosis Dementia Visual Impairment Edentulous Tobacco Dependence Partial Paralysis of right hand Illiterate in Native Language   PHYSICAL EXAM: BP (!) 90/58   Pulse 83   Temp 98 F (36.7 C) (Oral)   Wt 90 lb (40.8 kg)   SpO2 93%   BMI 18.81 kg/m  GEN: NAD, non-toxic  CV: RRR, no murmurs, rubs, or gallops PULM: CTAB, normal effort ABD: Soft, nontender, nondistended, NABS, no organomegaly SKIN: No rash or cyanosis; warm and well-perfused EXTR: No lower extremity edema or calf tenderness PSYCH: Mood and affect euthymic, normal rate and volume of speech NEURO: Awake, alert, no focal deficits grossly, normal speech; Diabetic Foot Exam - Simple   Simple Foot Form Diabetic Foot exam was performed with the following findings:  Yes 10/18/2017  7:31 PM  Visual Inspection No deformities, no ulcerations, no other skin breakdown bilaterally:  Yes Sensation Testing Intact to touch and monofilament testing bilaterally:  Yes Pulse Check Posterior Tibialis and Dorsalis pulse intact bilaterally:  Yes Comments      ASSESSMENT/PLAN:  Health maintenance:  -declines Dexa, colon cancer screening, shingles, PNA vaccine, or mammogram   Type 2 diabetes mellitus (HCC) A1c continues to rise above 11 today. In the past she has had issues with hypoglycemia with sulfonylureas. She is not the best candidate for SGLT; I think she is at risk for volume depletion with subsequent hypotension as her blood pressures are already low normal. We have been unable to increase the dose of her Victoza due to patient already dealing with decreased appetite and difficulty maintaining weight. I feel that she would greatly benefit from pharmacy clinic. I called her grand-daughter after the visit to inform; appointment made with Dr. Raymondo BandKoval this Thursday at 2:30PM. I asked that they bring her medications to all visits.    Palma HolterKanishka G Miangel Flom, MD PGY 3 [facility]

## 2017-10-18 ENCOUNTER — Other Ambulatory Visit: Payer: Self-pay

## 2017-10-18 ENCOUNTER — Encounter: Payer: Self-pay | Admitting: Internal Medicine

## 2017-10-18 ENCOUNTER — Telehealth: Payer: Self-pay | Admitting: Internal Medicine

## 2017-10-18 ENCOUNTER — Ambulatory Visit (INDEPENDENT_AMBULATORY_CARE_PROVIDER_SITE_OTHER): Payer: Medicaid Other | Admitting: Internal Medicine

## 2017-10-18 VITALS — BP 90/58 | HR 83 | Temp 98.0°F | Wt 90.0 lb

## 2017-10-18 DIAGNOSIS — E1165 Type 2 diabetes mellitus with hyperglycemia: Secondary | ICD-10-CM | POA: Diagnosis not present

## 2017-10-18 DIAGNOSIS — J449 Chronic obstructive pulmonary disease, unspecified: Secondary | ICD-10-CM | POA: Diagnosis not present

## 2017-10-18 DIAGNOSIS — IMO0001 Reserved for inherently not codable concepts without codable children: Secondary | ICD-10-CM

## 2017-10-18 DIAGNOSIS — E118 Type 2 diabetes mellitus with unspecified complications: Secondary | ICD-10-CM | POA: Diagnosis not present

## 2017-10-18 LAB — POCT GLYCOSYLATED HEMOGLOBIN (HGB A1C): HEMOGLOBIN A1C: 11.6

## 2017-10-18 MED ORDER — ATORVASTATIN CALCIUM 20 MG PO TABS: 20.0000 mg | ORAL_TABLET | Freq: Every day | ORAL | 0 refills | Status: DC

## 2017-10-18 MED ORDER — INSULIN PEN NEEDLE 31G X 8 MM MISC: 0 refills | Status: AC

## 2017-10-18 MED ORDER — ALBUTEROL SULFATE HFA 108 (90 BASE) MCG/ACT IN AERS: 2.0000 | INHALATION_SPRAY | RESPIRATORY_TRACT | 3 refills | Status: DC | PRN

## 2017-10-18 MED ORDER — METFORMIN HCL 1000 MG PO TABS: ORAL_TABLET | ORAL | 0 refills | Status: DC

## 2017-10-18 MED ORDER — TIOTROPIUM BROMIDE MONOHYDRATE 18 MCG IN CAPS: 18.0000 ug | ORAL_CAPSULE | Freq: Every day | RESPIRATORY_TRACT | 3 refills | Status: AC

## 2017-10-18 MED ORDER — GLUCOSE BLOOD VI STRP: ORAL_STRIP | 6 refills | Status: AC

## 2017-10-18 MED ORDER — VICTOZA 18 MG/3ML ~~LOC~~ SOPN: 0.6000 mg | PEN_INJECTOR | Freq: Every day | SUBCUTANEOUS | 0 refills | Status: DC

## 2017-10-18 MED ORDER — ASPIRIN EC 81 MG PO TBEC: 81.0000 mg | DELAYED_RELEASE_TABLET | Freq: Every day | ORAL | 3 refills | Status: AC

## 2017-10-18 NOTE — Telephone Encounter (Signed)
Called patient's grand-daughter to inform that patient's A1c. I think seeing Dr. Raymondo BandKoval would be the best for her. I made her an appointment with Dr. Raymondo BandKoval this Thursday at 2:30 PM

## 2017-10-18 NOTE — Assessment & Plan Note (Signed)
A1c continues to rise above 11 today. In the past she has had issues with hypoglycemia with sulfonylureas. She is not the best candidate for SGLT; I think she is at risk for volume depletion with subsequent hypotension as her blood pressures are already low normal. We have been unable to increase the dose of her Victoza due to patient already dealing with decreased appetite and difficulty maintaining weight. I feel that she would greatly benefit from pharmacy clinic. I called her grand-daughter after the visit to inform; appointment made with Dr. Raymondo BandKoval this Thursday at 2:30PM. I asked that they bring her medications to all visits.

## 2017-10-18 NOTE — Patient Instructions (Signed)
Please make a lab visit to get her cholesterol checked. Come in fasting.   I will call you with results of the tests.

## 2017-10-19 LAB — CMP14+EGFR
ALT: 20 IU/L (ref 0–32)
AST: 16 IU/L (ref 0–40)
Albumin/Globulin Ratio: 1.1 — ABNORMAL LOW (ref 1.2–2.2)
Albumin: 4 g/dL (ref 3.6–4.8)
Alkaline Phosphatase: 156 IU/L — ABNORMAL HIGH (ref 39–117)
BUN/Creatinine Ratio: 15 (ref 12–28)
BUN: 10 mg/dL (ref 8–27)
Bilirubin Total: <0.2 mg/dL (ref 0.0–1.2)
CALCIUM: 9.6 mg/dL (ref 8.7–10.3)
CO2: 24 mmol/L (ref 20–29)
Chloride: 98 mmol/L (ref 96–106)
Creatinine, Ser: 0.68 mg/dL (ref 0.57–1.00)
GFR calc non Af Amer: 90 mL/min/{1.73_m2} (ref >59)
GFR, EST AFRICAN AMERICAN: 103 mL/min/{1.73_m2} (ref >59)
GLUCOSE: 477 mg/dL — AB (ref 65–99)
Globulin, Total: 3.5 g/dL (ref 1.5–4.5)
Potassium: 4.4 mmol/L (ref 3.5–5.2)
Sodium: 136 mmol/L (ref 134–144)
TOTAL PROTEIN: 7.5 g/dL (ref 6.0–8.5)

## 2017-10-19 LAB — MICROALBUMIN / CREATININE URINE RATIO: Creatinine, Urine: 25.3 mg/dL

## 2017-10-20 ENCOUNTER — Ambulatory Visit: Payer: Medicaid Other | Admitting: Pharmacist

## 2017-10-20 ENCOUNTER — Encounter: Payer: Self-pay | Admitting: Pharmacist

## 2017-10-20 DIAGNOSIS — J449 Chronic obstructive pulmonary disease, unspecified: Secondary | ICD-10-CM

## 2017-10-20 DIAGNOSIS — E118 Type 2 diabetes mellitus with unspecified complications: Secondary | ICD-10-CM | POA: Diagnosis not present

## 2017-10-20 MED ORDER — DULAGLUTIDE 0.75 MG/0.5ML ~~LOC~~ SOAJ: 0.7500 mg | SUBCUTANEOUS | 2 refills | Status: DC

## 2017-10-20 MED ORDER — ALBUTEROL SULFATE HFA 108 (90 BASE) MCG/ACT IN AERS: 2.0000 | INHALATION_SPRAY | RESPIRATORY_TRACT | 3 refills | Status: AC | PRN

## 2017-10-20 NOTE — Progress Notes (Signed)
S:     Chief Complaint  Patient presents with  . Medication Management    diabetes    Patient arrives in good spirits and ambulating without any assistance. Presents for diabetes evaluation, education, and management at the request of Dr. Ottie Glazier. Patient was referred on 10/18/2017 and was last seen by Primary Care Provider that same day. Patient presents with an interpreter (Bishnu Paudel) and grandson Rason [phone]). Patient's grandson and granddaughter-in-law help with medications (getting from pharmacy and laying out so she can take them). Her grandson has photos of medications that she is taking on his phone for [place] to verify, but did not bring her meter to clinic.   Patient reports Diabetes was diagnosed ~10 years ago.  Admits that she hates injections and admits to skipping victoza commonly, sometimes for a week at a time. Last injection 3 days ago. She also expresses burning pain in her fingers with finger sticks. Checks CBGs 1-2 times daily: early morning (fasting) 100-300s most commnly 150-200, post-prandial 0-2 hours after eating runs 180-190 generally.   At the beginning of the visit, she expressed resistance to any further injections and refused to start insulin or any other injectable medications. She  was open to adding oral medications. After talking through her diabetes status further, she is willing to try Trulicity 0.75 mg once weekly. Her grandson is willing to help her with this.   For overall health, she denies h/o bladder CA, fractures, or any family history of heart failure. She smokes 2-3 cigarettes/day x50 years "Maybe" smoked more. Denies h/o PFTs. She is using albuterol once daily before sleep, saying makes breathing "much easier". Needs refill for albuterol - has been out for 2-3 days. [place] daily to visit friends.     Estimated Creatinine Clearance: 39.9 mL/min (by C-G formula based on SCr of 0.68  mg/dL).  Family/Social History: no one in family with DM or heart problems.   Patient reports adherence with medications. Has been skipping doses of Victoza. Current diabetes medications include: Metformin 1000 mg BID and Victoza 1.2 mg daily.  Current hypertension medications include: None  Patient denies hypoglycemic events.  Patient reported dietary habits: Eats 2 meals/day: rice two times daily, also eats breads, coffee and tea with brown sugar Today: Rice, yogurt, vegetables, tea; sometiumes has juice  Patient reported exercise habits: walks ~1 hour every day to visit friends.   Patient denies nocturia.  Patient denies neuropathy. Burning in fingers - attriubutes to gfinger sticks  Patient denies visual changes. Has had eye surgery.  Patient reports self foot exams.   O:  Physical Exam  Constitutional: She appears well-developed and well-nourished.    Review of Systems  Constitutional: Positive for weight loss.  Respiratory: Positive for cough.      Lab Results  Component Value Date   HGBA1C 11.6 10/18/2017   Vitals:   10/20/17 1530  BP: 120/70  Pulse: 96    Home fasting CBG: 100-300 2 hour post-prandial/random CBG:80-190  A/P: Diabetes longstanding currently uncontrolled evidenced with an A1c of 11.6. Patient denies hypoglycemic events and is able to verbalize appropriate hypoglycemia management plan. Patient denies adherence to daily Victoza. Control is suboptimal due to medication nonadherence and dietary indiscretions. Patient is amendable to starting Trulicity 0.75 mg once weekly. Will send paperwork to Medicaid. Explained to patient that if CBGs remain uncontrolled at next visit, will need to add on daily insulin. She expressed understanding. Will continue Victoza  0.6 mg daily for now until Trulicity is approved. Counseled patient and grandson on how to use Trulicity pen. Patient agreed to check CBGs once daily.   Written patient instructions provided.  Total  time in face to face counseling 30 minutes.   Follow up in Pharmacist Clinic Visit 2-4 weeks.  Patient seen with Remus LofflerGloria Adedoyin, PharmD Candidate and Adline PotterSabrina Dunham, PharmD.

## 2017-10-20 NOTE — Patient Instructions (Addendum)
Nice to meet you today.   You have had Diabetes for a long time.   Your body needs medications by taking a shot.   We will start a ONCE weekly shot.  Trulicity once weekly. STOP taking Victoza.   Check your blood sugar once a day - sometimes in the morning and sometimes in the evenings. Bring the blood sugar meter with you next time.   If this medication does not work, we will try to use insulin by injection once daily in the future.   Come back in 2-4 weeks

## 2017-10-20 NOTE — Assessment & Plan Note (Signed)
Diabetes longstanding currently uncontrolled evidenced with an A1c of 11.6. Patient denies hypoglycemic events and is able to verbalize appropriate hypoglycemia management plan. Patient denies adherence to daily Victoza. Control is suboptimal due to medication nonadherence and dietary indiscretions. Patient is amendable to starting Trulicity 0.75 mg once weekly. Will send paperwork to Medicaid. Explained to patient that if CBGs remain uncontrolled at next visit, will need to add on daily insulin. She expressed understanding. Will continue Victoza 0.6 mg daily for now until Trulicity is approved. Counseled patient and grandson on how to use Trulicity pen. Patient agreed to check CBGs once daily.

## 2017-10-21 ENCOUNTER — Telehealth: Payer: Self-pay

## 2017-10-21 NOTE — Telephone Encounter (Signed)
Prior approval for Trulicity completed via [place] [employer].  Confirmation [id number]#:[id number] W Prior Approval [id number]#:[id number]  Status: APPROVED for 10/21/17-10/21/18. [facility] notified.   Ples SpecterAlisa Brake, RN [facility] RN)

## 2017-10-25 ENCOUNTER — Telehealth: Payer: Self-pay | Admitting: Pharmacist

## 2017-10-25 NOTE — Telephone Encounter (Signed)
Called grandson, Rason, to communicate that PA for Trulicity was approved and medication should be available for pick up at her pharmacy. Rason verbalizes understanding and asks when to start Trulicity. Denies giving her Victoza today. Advised him to start Trulicity today and reinforced once-weekly dosing; d/c Victoza. He verbalizes understanding and declines my offer for a call back number. Denies further questions.   Allena Katzaroline E Idus Rathke, Pharm.D., BCPS PGY2 [facility] Resident Phone: [phone]

## 2017-11-10 ENCOUNTER — Ambulatory Visit (INDEPENDENT_AMBULATORY_CARE_PROVIDER_SITE_OTHER): Payer: Medicaid Other | Admitting: Pharmacist

## 2017-11-10 ENCOUNTER — Encounter: Payer: Self-pay | Admitting: Pharmacist

## 2017-11-10 DIAGNOSIS — E118 Type 2 diabetes mellitus with unspecified complications: Secondary | ICD-10-CM

## 2017-11-10 NOTE — Assessment & Plan Note (Signed)
Diabetes longstanding diagnosed currently better control with Trulicity. Patient denies hypoglycemic events and is able to verbalize appropriate hypoglycemia management plan. Patient reports adherence with medication. Continue Trulicity (dulaglutide) at same dose of 0.75mg  weekly. Next A1C anticipated end of May at next appointment. Patient has been taking CBGs more frequently and at various times in the day. Consider titration of Trulicity at next visit if CBGs elevated and no further weight loss.

## 2017-11-10 NOTE — Patient Instructions (Addendum)
It was nice to see you today. I hope you feel better soon. Try to rest and drink plenty of fluids. Continue taking your Trulicity weekly at the same dose. I would like to see you back at the end of May to recheck your A1c.

## 2017-11-10 NOTE — Progress Notes (Signed)
    S:    Patient arrives ambulating without assistance. Hortencia PilarGrandson, Rason, is also present at visit and serving as interpreter for patient. Presents for diabetes evaluation, education, and management at the request of Dr. Ottie GlazierGunadasa. Patient was referred on 10/18/2017.  Patient was last seen by Primary Care Provider on 10/18/17.   Patient reports not feeling well today with a runny nose and fever. Grandson reports she thinks she caught this from a sick contact not in the house. Lucila MaineGrandson says she has been taking some medication for the fever (liquid, unsure what it is probably APAP). She did pick up the new prescription for Trulicity and has been taking this medication weekly. Patient denies symptoms of nausea, vomiting or upset stomach. Grandson reports  patient's highest blood sugar has been 360, sometimes in the 200s. Lowest reported is 153. Usually has been in 160-170s both post-prandial and fasting since starting Trulicity. Usually checks blood sugar first thing in the morning and after meals. Patient grandson denies symptoms of hypoglycemia. Patient endorses some increased energy and she likes this.  Grandson questioned about getting a new prescription of Trulicity at the visit today.   Patient reports adherence with medications.  Current diabetes medications include: metformin 1000mg  PO BID, Trulicity 0.75mg  weekly  Patient denies hypoglycemic events.  Patient reports nocturia. Waking up about 2-3 times per night.  Patient reports neuropathy. Patient denies visual changes. Patient reports self foot exams. No issues.   O:  Physical Exam  Constitutional: She appears well-developed and well-nourished.     Review of Systems  Constitutional: Negative for weight loss.  Gastrointestinal: Negative for abdominal pain, nausea and vomiting.  All other systems reviewed and are negative.    Lab Results  Component Value Date   HGBA1C 11.6 10/18/2017   Vitals:   11/10/17 1448  BP: (!) 82/62    Pulse: 88  Temp: 98.5 F (36.9 C)  SpO2: 93%    A/P: Diabetes longstanding diagnosed currently better control with Trulicity. Patient denies hypoglycemic events and is able to verbalize appropriate hypoglycemia management plan. Patient reports adherence with medication. Continue Trulicity (dulaglutide) at same dose of 0.75mg  weekly. Next A1C anticipated end of May at next appointment. Patient has been taking CBGs more frequently and at various times in the day. Consider titration of Trulicity at next visit if CBGs elevated and no further weight loss.   Written patient instructions provided.  Total time in face to face counseling 30 minutes.   Follow up in Pharmacist Clinic Visit end of May.   Patient seen with Blake DivineShannon Parkey, PharmD, PGY1 Resident and Devota Pacearoline Welles, PGY2 Pharmacy Resident, PharmD, BCPS.

## 2017-11-11 NOTE — Progress Notes (Signed)
Patient ID: Kaylee Ramos, female   DOB: 02/22/1951, 67 y.o.   MRN: [id number] Reviewed: Agree with Dr. Macky LowerKoval's documentation and management.

## 2017-12-20 ENCOUNTER — Other Ambulatory Visit: Payer: Self-pay | Admitting: Internal Medicine

## 2018-01-06 ENCOUNTER — Ambulatory Visit: Payer: Medicaid Other | Admitting: Pharmacist

## 2018-01-06 ENCOUNTER — Encounter: Payer: Self-pay | Admitting: Pharmacist

## 2018-01-06 DIAGNOSIS — E1165 Type 2 diabetes mellitus with hyperglycemia: Secondary | ICD-10-CM | POA: Diagnosis not present

## 2018-01-06 DIAGNOSIS — E118 Type 2 diabetes mellitus with unspecified complications: Secondary | ICD-10-CM

## 2018-01-06 DIAGNOSIS — IMO0001 Reserved for inherently not codable concepts without codable children: Secondary | ICD-10-CM

## 2018-01-06 MED ORDER — METFORMIN HCL 1000 MG PO TABS: ORAL_TABLET | ORAL | 3 refills | Status: DC

## 2018-01-06 MED ORDER — DULAGLUTIDE 0.75 MG/0.5ML ~~LOC~~ SOAJ: 0.7500 mg | SUBCUTANEOUS | 2 refills | Status: AC

## 2018-01-06 MED ORDER — ATORVASTATIN CALCIUM 20 MG PO TABS: 20.0000 mg | ORAL_TABLET | Freq: Every day | ORAL | 3 refills | Status: DC

## 2018-01-06 NOTE — Assessment & Plan Note (Signed)
87 lb woman with diabetes longstanding diagnosed currently uncontrolled with last A1C of 11.6, current blood glucose readings in the 100s to low 200s. Patient denies hypoglycemic events. Patient reports adherence with medication. Patient has been on Trulicity for ~ 2 months.. Will continue weekly Trulicity 0.75 mg injections, as well as the twice daily metformin regimen. Plan to follow up and recheck Hgb A1C in one month.

## 2018-01-06 NOTE — Patient Instructions (Addendum)
Thank you for visiting [place] today!  1. CONTINUE current medication plan: Trulicity 0.75 mg injection weekly Metformin 1,000 mg tablet twice daily Atorvastatin 40 mg daily   Follow-up with Dr. Ottie Glazier later this month or early June.

## 2018-01-06 NOTE — Progress Notes (Signed)
     S:     Chief Complaint  Patient presents with  . Medication Management    diabetes   Patient arrives in good spirits and ambulating well. She is accompanied by her grandson, Rosan, who serves as the interpreter for this visit.  Presents for diabetes evaluation, education, and management as a follow-up to the last visit with [place] on 11/10/17.  Patient was last seen and referred to the pharmacy clinic by Dr. Kandis Mannan on 10/18/17.   Patient reports feeling well today. Grandson reports that patient has been checking her blood glucose regularly, with typical readings 140-200. Patient started on Trulicity last month and has had three doses. Patient reports feeling better since starting the medication.  Patient reports adherence with medications.  Current diabetes medications include: metformin 1000 mg twice daily, Trulicity 0.75 mg weekly  O:  Physical Exam  Constitutional: She appears well-developed and well-nourished.  Pulmonary/Chest: Effort normal.   Review of Systems  All other systems reviewed and are negative.  Lab Results  Component Value Date   HGBA1C 11.6 10/18/2017   Vitals:   01/06/18 1129  BP: 120/68  Pulse: 95  SpO2: 93%   A/P: 87 lb woman with diabetes longstanding diagnosed currently uncontrolled with last A1C of 11.6, current blood glucose readings in the 100s to low 200s. Patient denies hypoglycemic events. Patient reports adherence with medication. Patient has been on Trulicity for ~ 2 months.. Will continue weekly Trulicity 0.75 mg injections, as well as the twice daily metformin regimen. Plan to follow up and recheck Hgb A1C in one month.  Written patient instructions provided.  Total time in face to face counseling 30 minutes.   Follow up in Pharmacist Clinic Visit end of May/early June.   Patient seen with Tama Headings, PharmD Candidate and Ladell Pier, PharmD, PGY1 Pharmacy Resident.

## 2018-01-09 NOTE — Progress Notes (Signed)
Patient ID: Kaylee Ramos, female   DOB: 1951/02/22, 67 y.o.   MRN: [id number] Reviewed: Agree with Dr. Macky Lower documentation and management.

## 2018-03-17 ENCOUNTER — Telehealth: Payer: Self-pay

## 2018-03-17 ENCOUNTER — Other Ambulatory Visit: Payer: Self-pay | Admitting: *Deleted

## 2018-03-17 DIAGNOSIS — E118 Type 2 diabetes mellitus with unspecified complications: Secondary | ICD-10-CM

## 2018-03-17 DIAGNOSIS — E1165 Type 2 diabetes mellitus with hyperglycemia: Principal | ICD-10-CM

## 2018-03-17 DIAGNOSIS — IMO0001 Reserved for inherently not codable concepts without codable children: Secondary | ICD-10-CM

## 2018-03-17 NOTE — Telephone Encounter (Signed)
[facility] called nurse line stating they need an update med list faxed to their pharmacy. Pt has switched to them. Fax number [phone]. 

## 2018-03-17 NOTE — Telephone Encounter (Signed)
Medication list printed and placed in fax box.  Jazmin Hartsell,CMA  

## 2018-03-18 MED ORDER — ATORVASTATIN CALCIUM 20 MG PO TABS: 20.0000 mg | ORAL_TABLET | Freq: Every day | ORAL | 3 refills | Status: DC

## 2018-03-18 MED ORDER — METFORMIN HCL 1000 MG PO TABS: ORAL_TABLET | ORAL | 3 refills | Status: AC

## 2018-03-20 ENCOUNTER — Other Ambulatory Visit: Payer: Self-pay | Admitting: Family Medicine

## 2018-03-20 DIAGNOSIS — E1165 Type 2 diabetes mellitus with hyperglycemia: Principal | ICD-10-CM

## 2018-03-20 DIAGNOSIS — IMO0001 Reserved for inherently not codable concepts without codable children: Secondary | ICD-10-CM

## 2018-03-20 DIAGNOSIS — E118 Type 2 diabetes mellitus with unspecified complications: Secondary | ICD-10-CM
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
